# Patient Record
Sex: Male | Born: 1955 | Race: White | Hispanic: No | Smoking: Former smoker
Health system: Southern US, Community
[De-identification: ages and names within clinical notes are randomized; demographics above are authoritative.]

## PROBLEM LIST (undated history)

## (undated) DIAGNOSIS — G4730 Sleep apnea, unspecified: Secondary | ICD-10-CM

## (undated) DIAGNOSIS — I2510 Atherosclerotic heart disease of native coronary artery without angina pectoris: Secondary | ICD-10-CM

## (undated) DIAGNOSIS — I10 Essential (primary) hypertension: Secondary | ICD-10-CM

## (undated) DIAGNOSIS — T753XXA Motion sickness, initial encounter: Secondary | ICD-10-CM

## (undated) DIAGNOSIS — Z972 Presence of dental prosthetic device (complete) (partial): Secondary | ICD-10-CM

## (undated) DIAGNOSIS — K259 Gastric ulcer, unspecified as acute or chronic, without hemorrhage or perforation: Secondary | ICD-10-CM

## (undated) DIAGNOSIS — R011 Cardiac murmur, unspecified: Secondary | ICD-10-CM

## (undated) DIAGNOSIS — E785 Hyperlipidemia, unspecified: Secondary | ICD-10-CM

## (undated) DIAGNOSIS — Z91148 Patient's other noncompliance with medication regimen for other reason: Secondary | ICD-10-CM

## (undated) DIAGNOSIS — R55 Syncope and collapse: Secondary | ICD-10-CM

## (undated) DIAGNOSIS — I776 Arteritis, unspecified: Secondary | ICD-10-CM

## (undated) DIAGNOSIS — Z9114 Patient's other noncompliance with medication regimen: Secondary | ICD-10-CM

## (undated) HISTORY — PX: APPENDECTOMY: SHX54

## (undated) HISTORY — DX: Patient's other noncompliance with medication regimen for other reason: Z91.148

## (undated) HISTORY — DX: Syncope and collapse: R55

## (undated) HISTORY — DX: Essential (primary) hypertension: I10

## (undated) HISTORY — DX: Hyperlipidemia, unspecified: E78.5

## (undated) HISTORY — DX: Gastric ulcer, unspecified as acute or chronic, without hemorrhage or perforation: K25.9

## (undated) HISTORY — PX: HERNIA REPAIR: SHX51

## (undated) HISTORY — DX: Atherosclerotic heart disease of native coronary artery without angina pectoris: I25.10

## (undated) HISTORY — DX: Morbid (severe) obesity due to excess calories: E66.01

## (undated) HISTORY — DX: Arteritis, unspecified: I77.6

## (undated) HISTORY — DX: Patient's other noncompliance with medication regimen: Z91.14

---

## 2011-10-02 ENCOUNTER — Emergency Department: Payer: Self-pay | Admitting: Emergency Medicine

## 2011-10-02 LAB — CBC
HCT: 51.6 % (ref 40.0–52.0)
HGB: 17.5 g/dL (ref 13.0–18.0)
MCH: 30.9 pg (ref 26.0–34.0)
MCV: 91 fL (ref 80–100)
Platelet: 294 10*3/uL (ref 150–440)
RBC: 5.65 10*6/uL (ref 4.40–5.90)
RDW: 13.2 % (ref 11.5–14.5)

## 2011-10-02 LAB — COMPREHENSIVE METABOLIC PANEL
Albumin: 4.3 g/dL (ref 3.4–5.0)
BUN: 6 mg/dL — ABNORMAL LOW (ref 7–18)
Bilirubin,Total: 1.3 mg/dL — ABNORMAL HIGH (ref 0.2–1.0)
Calcium, Total: 9.4 mg/dL (ref 8.5–10.1)
Co2: 28 mmol/L (ref 21–32)
EGFR (African American): >60
Potassium: 4.3 mmol/L (ref 3.5–5.1)
SGOT(AST): 31 U/L (ref 15–37)
SGPT (ALT): 32 U/L
Sodium: 138 mmol/L (ref 136–145)

## 2012-12-03 DIAGNOSIS — I219 Acute myocardial infarction, unspecified: Secondary | ICD-10-CM

## 2012-12-03 DIAGNOSIS — I2510 Atherosclerotic heart disease of native coronary artery without angina pectoris: Secondary | ICD-10-CM | POA: Insufficient documentation

## 2012-12-03 HISTORY — PX: CARDIAC CATHETERIZATION: SHX172

## 2012-12-03 HISTORY — DX: Acute myocardial infarction, unspecified: I21.9

## 2012-12-18 ENCOUNTER — Inpatient Hospital Stay: Payer: Self-pay | Admitting: Cardiovascular Disease

## 2012-12-18 ENCOUNTER — Encounter: Payer: Self-pay | Admitting: Cardiovascular Disease

## 2012-12-18 DIAGNOSIS — I2510 Atherosclerotic heart disease of native coronary artery without angina pectoris: Secondary | ICD-10-CM

## 2012-12-18 DIAGNOSIS — I2119 ST elevation (STEMI) myocardial infarction involving other coronary artery of inferior wall: Secondary | ICD-10-CM

## 2012-12-18 LAB — CBC
MCH: 30.7 pg (ref 26.0–34.0)
MCHC: 34.5 g/dL (ref 32.0–36.0)
MCV: 89 fL (ref 80–100)
RBC: 5.85 10*6/uL (ref 4.40–5.90)
RDW: 14.9 % — ABNORMAL HIGH (ref 11.5–14.5)

## 2012-12-18 LAB — BASIC METABOLIC PANEL
Anion Gap: 7 (ref 7–16)
BUN: 10 mg/dL (ref 7–18)
Calcium, Total: 9.4 mg/dL (ref 8.5–10.1)
Chloride: 103 mmol/L (ref 98–107)
Creatinine: 1.1 mg/dL (ref 0.60–1.30)
EGFR (Non-African Amer.): >60
Glucose: 114 mg/dL — ABNORMAL HIGH (ref 65–99)
Osmolality: 270 (ref 275–301)
Potassium: 4.1 mmol/L (ref 3.5–5.1)
Sodium: 135 mmol/L — ABNORMAL LOW (ref 136–145)

## 2012-12-18 LAB — PROTIME-INR: INR: 1

## 2012-12-18 LAB — TROPONIN I
Troponin-I: <0.02 ng/mL
Troponin-I: 5.8 ng/mL — ABNORMAL HIGH

## 2012-12-18 LAB — CK TOTAL AND CKMB (NOT AT ARMC)
CK, Total: 164 U/L (ref 35–232)
CK, Total: 219 U/L (ref 35–232)
CK-MB: 1.1 ng/mL (ref 0.5–3.6)

## 2012-12-19 ENCOUNTER — Encounter: Payer: Self-pay | Admitting: Cardiovascular Disease

## 2012-12-19 DIAGNOSIS — I2119 ST elevation (STEMI) myocardial infarction involving other coronary artery of inferior wall: Secondary | ICD-10-CM

## 2012-12-19 LAB — BASIC METABOLIC PANEL
Anion Gap: 5 — ABNORMAL LOW (ref 7–16)
Calcium, Total: 8.8 mg/dL (ref 8.5–10.1)
Chloride: 106 mmol/L (ref 98–107)
Co2: 25 mmol/L (ref 21–32)
Creatinine: 0.98 mg/dL (ref 0.60–1.30)
EGFR (African American): >60
EGFR (Non-African Amer.): >60
Osmolality: 270 (ref 275–301)
Potassium: 4.1 mmol/L (ref 3.5–5.1)

## 2012-12-19 LAB — LIPID PANEL
HDL Cholesterol: 31 mg/dL — ABNORMAL LOW (ref 40–60)
Ldl Cholesterol, Calc: 153 mg/dL — ABNORMAL HIGH (ref 0–100)

## 2012-12-19 LAB — TROPONIN I: Troponin-I: 3.3 ng/mL — ABNORMAL HIGH

## 2012-12-20 ENCOUNTER — Telehealth: Payer: Self-pay

## 2012-12-20 NOTE — Telephone Encounter (Signed)
Message copied by [facility], Tonia Avino E on Wed Dec 20, 2012 11:05 AM ------      Message from: Coralee Rud      Created: Wed Dec 20, 2012 10:59 AM      Regarding: tcm/ph       Dr Kirke Corin 12/25/12 at 10:30 ------

## 2012-12-20 NOTE — Telephone Encounter (Signed)
tcm

## 2012-12-20 NOTE — Telephone Encounter (Signed)
Pt d/c today 6/18 Will attempt TCM #1 6/19

## 2012-12-21 NOTE — Telephone Encounter (Signed)
TCM #1 attempt Per family member pt not home at the time Will try again tomm

## 2012-12-22 ENCOUNTER — Encounter: Payer: Self-pay | Admitting: *Deleted

## 2012-12-22 NOTE — Telephone Encounter (Signed)
Patient contacted regarding discharge from [facility] on 12/25/12.  Patient understands to follow up with provider Dr Kirke Corin on 6/23 at 10:30 at [place]. Patient understands discharge instructions? yes Patient understands medications and regiment? yes Patient understands to bring all medications to this visit? yes

## 2012-12-25 ENCOUNTER — Encounter: Payer: Self-pay | Admitting: Cardiovascular Disease

## 2012-12-25 ENCOUNTER — Ambulatory Visit (INDEPENDENT_AMBULATORY_CARE_PROVIDER_SITE_OTHER): Payer: Medicaid Other | Admitting: Cardiovascular Disease

## 2012-12-25 VITALS — BP 130/80 | HR 62 | Ht 70.5 in | Wt 193.8 lb

## 2012-12-25 DIAGNOSIS — E785 Hyperlipidemia, unspecified: Secondary | ICD-10-CM | POA: Insufficient documentation

## 2012-12-25 DIAGNOSIS — R0789 Other chest pain: Secondary | ICD-10-CM

## 2012-12-25 DIAGNOSIS — I2510 Atherosclerotic heart disease of native coronary artery without angina pectoris: Secondary | ICD-10-CM

## 2012-12-25 DIAGNOSIS — I10 Essential (primary) hypertension: Secondary | ICD-10-CM | POA: Insufficient documentation

## 2012-12-25 MED ORDER — CLOPIDOGREL BISULFATE 75 MG PO TABS: 75.0000 mg | ORAL_TABLET | Freq: Every day | ORAL | Status: DC

## 2012-12-25 NOTE — Assessment & Plan Note (Signed)
He is currently on high dose atorvastatin. I recommend a followup lipid and liver profile in one month.

## 2012-12-25 NOTE — Assessment & Plan Note (Signed)
He is doing very well after recent inferior myocardial infarction and drug-eluting stent placement to the right coronary artery. He is tolerating dual antiplatelet therapy. However, due to cost reasons, I will switch him from Effient to Plavix once he finishes current supply. He is to stay on dual antiplatelet therapy for a minimum of one year. He can resume activities and exercise. He quit smoking and was congratulated.

## 2012-12-25 NOTE — Progress Notes (Signed)
HPI  This is a 57 year old male who is here today for a followup visit. He presented early this month at [name] with acute inferior ST elevation myocardial infarction. He underwent cardiac catheterization which showed an occluded proximal RCA with mild disease in the left circumflex and LAD. He underwent successful thrombectomy and drug-eluting stent placement without complications. Ejection fraction was normal. Hospital course was unremarkable. Overall, he has been doing very well and denies any significant chest discomfort or dyspnea. He feels that his chest is still bruised. He quit smoking since that event.   No Known Allergies   Current Outpatient Prescriptions on File Prior to Visit  Medication Sig Dispense Refill  . aspirin 81 MG tablet Take 81 mg by mouth daily.      [name] atorvastatin (LIPITOR) 80 MG tablet Take 80 mg by mouth daily.      [name] lisinopril (PRINIVIL,ZESTRIL) 20 MG tablet Take 20 mg by mouth daily.      . metoprolol tartrate (LOPRESSOR) 25 MG tablet Take 25 mg by mouth every 12 (twelve) hours.      . nitroGLYCERIN (NITROSTAT) 0.4 MG SL tablet Place 0.4 mg under the tongue every 5 (five) minutes as needed for chest pain.      . prasugrel (EFFIENT) 10 MG TABS Take 10 mg by mouth daily.       No current facility-administered medications on file prior to visit.     Past Medical History  Diagnosis Date  . Stomach ulcer   . MI (myocardial infarction)   . Coronary artery disease 06 / 2014    Acute inferior ST elevation myocardial infarction. Cardiac catheterization showed an occluded proximal RCA with mild LAD/left circumflex disease. Successful thrombectomy and drug-eluting stent placement to the proximal RCA. Ejection fraction was normal.  . Hyperlipidemia   . Hypertension      Past Surgical History  Procedure Laterality Date  . Hernia repair    . Appendectomy    . Cardiac catheterization  6/14    [facility];EF 65%     Family History  Problem Relation Age of Onset    . Heart failure Father   . Heart disease Father      History   Social History  . Marital Status: Married    Spouse Name: [name]    Number of Children: [name]  . Years of Education: [name]   Occupational History  . Not on file.   Social History Main Topics  . Smoking status: Current Every Day Smoker -- 1.00 packs/day for 30 years    Types: Cigarettes  . Smokeless tobacco: Not on file  . Alcohol Use: No  . Drug Use: Yes     Comment: past marijuana  . Sexually Active: Not on file   Other Topics Concern  . Not on file   Social History Narrative  . No narrative on file     PHYSICAL EXAM   BP 130/80  Pulse 62  Ht 5' 10.5" (1.791 m)  Wt 193 lb 12 oz (87.884 kg)  BMI 27.4 kg/m2 Constitutional: He is oriented to person, place, and time. He appears well-developed and well-nourished. No distress.  HENT: No nasal discharge.  Head: Normocephalic and atraumatic.  Eyes: Pupils are equal and round. Right eye exhibits no discharge. Left eye exhibits no discharge.  Neck: Normal range of motion. Neck supple. No JVD present. No thyromegaly present.  Cardiovascular: Normal rate, regular rhythm, normal heart sounds and. Exam reveals no gallop and no friction rub. No  murmur heard.  Pulmonary/Chest: Effort normal and breath sounds normal. No stridor. No respiratory distress. He has no wheezes. He has no rales. He exhibits no tenderness.  Abdominal: Soft. Bowel sounds are normal. He exhibits no distension. There is no tenderness. There is no rebound and no guarding.  Musculoskeletal: Normal range of motion. He exhibits no edema and no tenderness.  Neurological: He is alert and oriented to person, place, and time. Coordination normal.  Skin: Skin is warm and dry. No rash noted. He is not diaphoretic. No erythema. No pallor.  Psychiatric: He has a normal mood and affect. His behavior is normal. Judgment and thought content normal.  Right radial pulse is normal with no hematoma.     [id number]:[id number]   Rhythm  -Left axis -anterior fascicular block.   -  Nonspecific T-abnormality.   ABNORMAL     ASSESSMENT AND PLAN

## 2012-12-25 NOTE — Assessment & Plan Note (Signed)
Blood pressure is well controlled on current medications. 

## 2012-12-25 NOTE — Patient Instructions (Addendum)
Start Plavix after you finish current supplies of Effient.  Continue other medications.  Fasting Labs in 1 month.  Follow up in 3 months

## 2013-01-19 ENCOUNTER — Telehealth: Payer: Self-pay | Admitting: *Deleted

## 2013-01-19 ENCOUNTER — Other Ambulatory Visit: Payer: Self-pay | Admitting: *Deleted

## 2013-01-19 MED ORDER — LISINOPRIL 20 MG PO TABS: 20.0000 mg | ORAL_TABLET | Freq: Every day | ORAL | Status: DC

## 2013-01-19 MED ORDER — CLOPIDOGREL BISULFATE 75 MG PO TABS: 75.0000 mg | ORAL_TABLET | Freq: Every day | ORAL | Status: DC

## 2013-01-19 MED ORDER — ATORVASTATIN CALCIUM 80 MG PO TABS: 80.0000 mg | ORAL_TABLET | Freq: Every day | ORAL | Status: DC

## 2013-01-19 MED ORDER — METOPROLOL TARTRATE 25 MG PO TABS: 25.0000 mg | ORAL_TABLET | Freq: Two times a day (BID) | ORAL | Status: DC

## 2013-01-19 NOTE — Telephone Encounter (Signed)
Refill was called into pharmacy and they have questions regarding the amount, please call back at [phone]

## 2013-01-19 NOTE — Telephone Encounter (Signed)
Refilled Lipitor, Plavix, lisinopril and metoprolol sent to [facility].

## 2013-01-19 NOTE — Telephone Encounter (Signed)
Notified pharmacy patient needed to have 60 tablets for the metoprolol. Told pharmacy patient can have 60 tablets.

## 2013-01-24 ENCOUNTER — Ambulatory Visit (INDEPENDENT_AMBULATORY_CARE_PROVIDER_SITE_OTHER): Payer: Medicaid Other

## 2013-01-24 DIAGNOSIS — E785 Hyperlipidemia, unspecified: Secondary | ICD-10-CM

## 2013-01-25 ENCOUNTER — Encounter: Payer: Self-pay | Admitting: *Deleted

## 2013-01-25 LAB — HEPATIC FUNCTION PANEL
AST: 49 IU/L — ABNORMAL HIGH (ref 0–40)
Albumin: 4.4 g/dL (ref 3.5–5.5)
Total Bilirubin: 0.9 mg/dL (ref 0.0–1.2)
Total Protein: 7.4 g/dL (ref 6.0–8.5)

## 2013-01-25 LAB — LIPID PANEL
Chol/HDL Ratio: 3.2 ratio units (ref 0.0–5.0)
HDL: 32 mg/dL — ABNORMAL LOW (ref >39)
LDL Calculated: 51 mg/dL (ref 0–99)

## 2013-02-06 ENCOUNTER — Telehealth: Payer: Self-pay

## 2013-02-06 NOTE — Telephone Encounter (Signed)
Request from Disability Determination Services , sent to [employer] on 02/06/2013 .

## 2013-02-19 ENCOUNTER — Telehealth: Payer: Self-pay | Admitting: *Deleted

## 2013-02-19 NOTE — Telephone Encounter (Signed)
Request from Disability Determination Services, sent to [employer] on 02/19/13 .

## 2013-02-22 ENCOUNTER — Telehealth: Payer: Self-pay | Admitting: *Deleted

## 2013-02-22 MED ORDER — ATORVASTATIN CALCIUM 80 MG PO TABS: ORAL_TABLET | ORAL | Status: DC

## 2013-02-22 NOTE — Telephone Encounter (Addendum)
Called patient in follow up to lab work completed on 7/23. He will decrease Lipitor to 40mg  every day and come back in 1 month to have labs checked during MD visit. Patient aware to come to appointment fasting so we can recheck his lipid and liver panel.

## 2013-03-29 ENCOUNTER — Ambulatory Visit: Payer: Self-pay | Admitting: Cardiovascular Disease

## 2013-03-30 ENCOUNTER — Ambulatory Visit: Payer: Self-pay | Admitting: Cardiovascular Disease

## 2013-03-30 ENCOUNTER — Telehealth: Payer: Self-pay | Admitting: *Deleted

## 2013-03-30 NOTE — Telephone Encounter (Signed)
Pt has an afternoon app next week, wants to know if he needs to be fasting, I will see if dr Kirke Corin needs to recheck lipids/ pt needs liver recheck due to elevated liver  Enzymes.   Spoke with Dr Arida/ pt will only need liver, no lipid, pt informed.

## 2013-04-02 ENCOUNTER — Encounter: Payer: Self-pay | Admitting: Cardiovascular Disease

## 2013-04-02 ENCOUNTER — Ambulatory Visit (INDEPENDENT_AMBULATORY_CARE_PROVIDER_SITE_OTHER): Payer: Medicaid Other | Admitting: Cardiovascular Disease

## 2013-04-02 VITALS — BP 166/102 | HR 65 | Ht 70.5 in | Wt 195.8 lb

## 2013-04-02 DIAGNOSIS — I2510 Atherosclerotic heart disease of native coronary artery without angina pectoris: Secondary | ICD-10-CM

## 2013-04-02 DIAGNOSIS — R0789 Other chest pain: Secondary | ICD-10-CM

## 2013-04-02 DIAGNOSIS — I10 Essential (primary) hypertension: Secondary | ICD-10-CM

## 2013-04-02 DIAGNOSIS — E785 Hyperlipidemia, unspecified: Secondary | ICD-10-CM

## 2013-04-02 MED ORDER — ATORVASTATIN CALCIUM 10 MG PO TABS: 10.0000 mg | ORAL_TABLET | Freq: Every day | ORAL | Status: DC

## 2013-04-02 MED ORDER — LISINOPRIL 40 MG PO TABS: 40.0000 mg | ORAL_TABLET | Freq: Every day | ORAL | Status: DC

## 2013-04-02 NOTE — Assessment & Plan Note (Signed)
He had recent elevation in liver profile on high dose atorvastatin. He also has bilateral leg cramps which could be related to this. Thus, I will decrease the dose to 10 mg daily. I will repeat his liver profile today. If he continues to have lower extremity cramps, I will consider lower extremity arterial Doppler given his diminished pulses and previous history of smoking.

## 2013-04-02 NOTE — Patient Instructions (Addendum)
Labs today.   Increase Lisinopril to 40 mg once daily.  Decrease Atorvastatin 10 mg daily.   Follow up in 6 months.

## 2013-04-02 NOTE — Assessment & Plan Note (Signed)
Blood pressure is elevated. I increased lisinopril to 40 mg once daily. Check routine labs today.

## 2013-04-02 NOTE — Assessment & Plan Note (Signed)
He seems to be stable from a cardiac standpoint. Continue medical therapy. He is to stay on dual antiplatelet therapy until June of 2015.

## 2013-04-02 NOTE — Progress Notes (Signed)
HPI  This is a 57 year old male who is here today for a followup visit. He presented in 12/2012 to [facility] with acute inferior ST elevation myocardial infarction. He underwent cardiac catheterization which showed an occluded proximal RCA with mild disease in the left circumflex and LAD. He underwent successful thrombectomy and drug-eluting stent placement without complications. Ejection fraction was normal. Hospital course was unremarkable. He quit smoking since that event. He did have elevated liver enzymes on atorvastatin and thus the dose was decreased to 40 mg once daily. He complains of bilateral leg cramps with walking worse on the right side than the left side. He complains of occasional but not frequent chest discomfort which has been mostly nonexertional. He does have mild dyspnea.   No Known Allergies   Current Outpatient Prescriptions on File Prior to Visit  Medication Sig Dispense Refill  . aspirin 81 MG tablet Take 81 mg by mouth daily.      [name] atorvastatin (LIPITOR) 80 MG tablet One half tab every day      . clopidogrel (PLAVIX) 75 MG tablet Take 1 tablet (75 mg total) by mouth daily.  30 tablet  3  . lisinopril (PRINIVIL,ZESTRIL) 20 MG tablet Take 1 tablet (20 mg total) by mouth daily.  30 tablet  3  . metoprolol tartrate (LOPRESSOR) 25 MG tablet Take 1 tablet (25 mg total) by mouth every 12 (twelve) hours.  30 tablet  3  . nitroGLYCERIN (NITROSTAT) 0.4 MG SL tablet Place 0.4 mg under the tongue every 5 (five) minutes as needed for chest pain.      . prasugrel (EFFIENT) 10 MG TABS Take 10 mg by mouth daily.       No current facility-administered medications on file prior to visit.     Past Medical History  Diagnosis Date  . Stomach ulcer   . MI (myocardial infarction)   . Coronary artery disease 06 / 2014    Acute inferior ST elevation myocardial infarction. Cardiac catheterization showed an occluded proximal RCA with mild LAD/left circumflex disease. Successful thrombectomy  and drug-eluting stent placement to the proximal RCA. Ejection fraction was normal.  . Hyperlipidemia   . Hypertension      Past Surgical History  Procedure Laterality Date  . Hernia repair    . Appendectomy    . Cardiac catheterization  6/14    [facility];EF 65%     Family History  Problem Relation Age of Onset  . Heart failure Father   . Heart disease Father      History   Social History  . Marital Status: Married    Spouse Name: [name]    Number of Children: [name]  . Years of Education: [name]   Occupational History  . Not on file.   Social History Main Topics  . Smoking status: Current Every Day Smoker -- 1.00 packs/day for 30 years    Types: Cigarettes  . Smokeless tobacco: Not on file  . Alcohol Use: No  . Drug Use: Yes     Comment: past marijuana  . Sexual Activity: Not on file   Other Topics Concern  . Not on file   Social History Narrative  . No narrative on file     PHYSICAL EXAM   BP 166/102  Pulse 65  Ht 5' 10.5" (1.791 m)  Wt 195 lb 12 oz (88.792 kg)  BMI 27.68 kg/m2 Constitutional: He is oriented to person, place, and time. He appears well-developed and well-nourished. No distress.  HENT: No nasal  discharge.  Head: Normocephalic and atraumatic.  Eyes: Pupils are equal and round. Right eye exhibits no discharge. Left eye exhibits no discharge.  Neck: Normal range of motion. Neck supple. No JVD present. No thyromegaly present.  Cardiovascular: Normal rate, regular rhythm, normal heart sounds and. Exam reveals no gallop and no friction rub. No murmur heard.  Pulmonary/Chest: Effort normal and breath sounds normal. No stridor. No respiratory distress. He has no wheezes. He has no rales. He exhibits no tenderness.  Abdominal: Soft. Bowel sounds are normal. He exhibits no distension. There is no tenderness. There is no rebound and no guarding.  Musculoskeletal: Normal range of motion. He exhibits no edema and no tenderness.  Neurological: He is alert and  oriented to person, place, and time. Coordination normal.  Skin: Skin is warm and dry. No rash noted. He is not diaphoretic. No erythema. No pallor.  Psychiatric: He has a normal mood and affect. His behavior is normal. Judgment and thought content normal.  Vascular: Femoral pulses are normal. Dorsalis pedis: Absent on the right side and +1 on the left side. Posterior tibial is normal bilaterally.     EKG: Normal sinus rhythm with left axis deviation. Nonspecific T wave changes.  ABNORMAL     ASSESSMENT AND PLAN

## 2013-04-03 LAB — CBC WITH DIFFERENTIAL/PLATELET
Basophils Absolute: 0.1 10*3/uL (ref 0.0–0.2)
HCT: 45.1 % (ref 37.5–51.0)
Immature Granulocytes: 0 %
Lymphocytes Absolute: 2.1 10*3/uL (ref 0.7–3.1)
MCHC: 34.4 g/dL (ref 31.5–35.7)
Monocytes Absolute: 0.5 10*3/uL (ref 0.1–0.9)
Monocytes: 7 %
RDW: 14.2 % (ref 12.3–15.4)

## 2013-04-03 LAB — BASIC METABOLIC PANEL
BUN/Creatinine Ratio: 14 (ref 9–20)
BUN: 15 mg/dL (ref 6–24)
CO2: 25 mmol/L (ref 18–29)
Chloride: 98 mmol/L (ref 97–108)
Potassium: 5.3 mmol/L — ABNORMAL HIGH (ref 3.5–5.2)
Sodium: 140 mmol/L (ref 134–144)

## 2013-04-03 LAB — HEPATIC FUNCTION PANEL
ALT: 81 IU/L — ABNORMAL HIGH (ref 0–44)
AST: 46 IU/L — ABNORMAL HIGH (ref 0–40)

## 2013-04-05 ENCOUNTER — Telehealth: Payer: Self-pay

## 2013-04-05 DIAGNOSIS — I25810 Atherosclerosis of coronary artery bypass graft(s) without angina pectoris: Secondary | ICD-10-CM

## 2013-04-05 DIAGNOSIS — E785 Hyperlipidemia, unspecified: Secondary | ICD-10-CM

## 2013-04-05 NOTE — Telephone Encounter (Signed)
Message copied by Marilynne Halsted on Thu Apr 05, 2013 10:21 AM ------      Message from: Lorine Bears A      Created: Wed Apr 04, 2013  4:06 PM       Inform patient that labs were fine. K is slightly high but likely due to delayed processing. Liver function improved but still not back normal. I am hoping that liver test will go back to normal after decreasing Atorvastatin dose.       Repeat liver profile in 4-5 weeks. ------

## 2013-04-05 NOTE — Telephone Encounter (Signed)
Spoke w/ pt.  He is aware of results.   States that he was not aware he was supposed to be fasting and drank some apple juice before having labs drawn.  He is aware that he needs to have labs drawn in 4-5 weeks, but does not want to schedule that appt at this time.  He will note it on his calendar and call us back with a date and time.

## 2013-04-09 ENCOUNTER — Telehealth: Payer: Self-pay

## 2013-04-09 NOTE — Telephone Encounter (Signed)
Request from Disability Determination Services, sent to [employer] on 04/10/2013 .  

## 2013-04-25 ENCOUNTER — Telehealth: Payer: Self-pay

## 2013-04-25 NOTE — Telephone Encounter (Signed)
Request from Disability Determination Services, sent to [employer] on 04/25/2013 . [name]

## 2013-04-27 ENCOUNTER — Telehealth: Payer: Self-pay

## 2013-04-27 NOTE — Telephone Encounter (Signed)
Received fax from cardiac rehab at [facility] that pt did not show to appt, "cost of program is a financial hardship".

## 2013-04-30 ENCOUNTER — Other Ambulatory Visit: Payer: Self-pay | Admitting: *Deleted

## 2013-04-30 MED ORDER — ATORVASTATIN CALCIUM 10 MG PO TABS: 10.0000 mg | ORAL_TABLET | Freq: Every day | ORAL | Status: DC

## 2013-04-30 MED ORDER — LISINOPRIL 40 MG PO TABS: 40.0000 mg | ORAL_TABLET | Freq: Every day | ORAL | Status: DC

## 2013-04-30 NOTE — Telephone Encounter (Signed)
Requested Prescriptions   Signed Prescriptions Disp Refills  . lisinopril (PRINIVIL,ZESTRIL) 40 MG tablet 30 tablet 6    Sig: Take 1 tablet (40 mg total) by mouth daily.    Authorizing Provider: Lorine Bears A    Ordering User: Shawnie Dapper, Errol Ala C  . atorvastatin (LIPITOR) 10 MG tablet 30 tablet 6    Sig: Take 1 tablet (10 mg total) by mouth daily.    Authorizing Provider: Lorine Bears A    Ordering User: Kendrick Fries

## 2013-04-30 NOTE — Telephone Encounter (Signed)
[facility] is too expenisive please send into [facility]

## 2013-05-18 ENCOUNTER — Other Ambulatory Visit: Payer: Self-pay

## 2013-05-18 MED ORDER — METOPROLOL TARTRATE 25 MG PO TABS: 25.0000 mg | ORAL_TABLET | Freq: Two times a day (BID) | ORAL | Status: DC

## 2013-05-18 NOTE — Telephone Encounter (Signed)
Refill sent for metoprolol tart  

## 2013-05-23 ENCOUNTER — Telehealth: Payer: Self-pay

## 2013-05-23 NOTE — Telephone Encounter (Signed)
Request from [employer] , sent to [employer] on 05/23/2013 .

## 2013-06-11 ENCOUNTER — Telehealth: Payer: Self-pay

## 2013-06-11 NOTE — Telephone Encounter (Signed)
Request from [employer] , sent to [employer] on 06/11/2013 .

## 2013-06-25 ENCOUNTER — Telehealth: Payer: Self-pay

## 2013-06-25 NOTE — Telephone Encounter (Signed)
Request from Disability Determination Services, sent to [employer] on 06/25/2013  ORIGINAL REQUEST RECEIVED AND SENT .

## 2013-10-12 ENCOUNTER — Encounter: Payer: Self-pay | Admitting: Cardiovascular Disease

## 2013-10-12 ENCOUNTER — Ambulatory Visit (INDEPENDENT_AMBULATORY_CARE_PROVIDER_SITE_OTHER): Payer: Medicaid Other | Admitting: Cardiovascular Disease

## 2013-10-12 VITALS — BP 110/80 | HR 66 | Ht 70.5 in | Wt 190.0 lb

## 2013-10-12 DIAGNOSIS — E785 Hyperlipidemia, unspecified: Secondary | ICD-10-CM

## 2013-10-12 DIAGNOSIS — R079 Chest pain, unspecified: Secondary | ICD-10-CM

## 2013-10-12 DIAGNOSIS — I10 Essential (primary) hypertension: Secondary | ICD-10-CM

## 2013-10-12 NOTE — Assessment & Plan Note (Signed)
He had a recent episode of exertional chest pain with exercise. Current EKG is normal. I recommend a treadmill Myoview for evaluation. He has known history of coronary artery disease with stenting of the right coronary artery last year in the setting of inferior ST elevation myocardial infarction. Continue dual antiplatelet therapy for at least another year.

## 2013-10-12 NOTE — Patient Instructions (Addendum)
[place]  Your caregiver has ordered a Stress Test with nuclear imaging. The purpose of this test is to evaluate the blood supply to your heart muscle. This procedure is referred to as a "Non-Invasive Stress Test." This is because other than having an IV started in your vein, nothing is inserted or "invades" your body. Cardiac stress tests are done to find areas of poor blood flow to the heart by determining the extent of coronary artery disease (CAD). Some patients exercise on a treadmill, which naturally increases the blood flow to your heart, while others who are  unable to walk on a treadmill due to physical limitations have a pharmacologic/chemical stress agent called Lexiscan . This medicine will mimic walking on a treadmill by temporarily increasing your coronary blood flow.   Please note: these test may take anywhere between 2-4 hours to complete  PLEASE REPORT TO [facility] [facility] AT THE FIRST DESK WILL DIRECT YOU WHERE TO GO  Date of Procedure:_____________4/14/15________________________  Arrival Time for Procedure:___________0745 AM___________________  Instructions regarding medication:     _X__:  Hold betablocker(s) night before procedure and morning of procedure: Metoprolol   PLEASE NOTIFY THE OFFICE AT LEAST 24 HOURS IN ADVANCE IF YOU ARE UNABLE TO KEEP YOUR APPOINTMENT.  [phone] AND  PLEASE NOTIFY NUCLEAR MEDICINE AT [facility] AT LEAST 24 HOURS IN ADVANCE IF YOU ARE UNABLE TO KEEP YOUR APPOINTMENT. [phone]  How to prepare for your Myoview test:  1. Do not eat or drink after midnight 2. No caffeine for 24 hours prior to test 3. No smoking 24 hours prior to test. 4. Your medication may be taken with water.  If your doctor stopped a medication because of this test, do not take that medication. 5. Ladies, please do not wear dresses.  Skirts or pants are appropriate. Please wear a short sleeve shirt. 6. No perfume, cologne or lotion. 7. Wear  comfortable walking shoes. No heels!  Your physician recommends that you have lab work today: BMP  Lipid and Liver Profile    Your physician wants you to follow-up in: 6 months receive a reminder letter in the mail two months in advance. If you don't receive a letter, please call our office to schedule the follow-up appointment.

## 2013-10-12 NOTE — Assessment & Plan Note (Signed)
Recheck fasting lipid and liver profile especially that he had a normal liver tests on higher dose of atorvastatin.

## 2013-10-12 NOTE — Progress Notes (Signed)
HPI  This is a 58 year old male who is here today for a followup visit. He presented in 12/2012 to [facility] with acute inferior ST elevation myocardial infarction. He underwent cardiac catheterization which showed an occluded proximal RCA with mild disease in the left circumflex and LAD. He underwent successful thrombectomy and drug-eluting stent placement without complications. Ejection fraction was normal. Hospital course was unremarkable. He quit smoking since that event. He did have elevated liver enzymes on atorvastatin and thus the dose was gradually decreased to 10 mg once daily. No extremity cramping resolved. He reports an episode of substernal chest tightness about 10 days ago when he tried to play basketball. He had substernal chest tightness with exercise which lasted for about 10 minutes. It resolved with rest and did not require nitroglycerin. He reports mild to episodes of chest tightness with regular activities.   No Known Allergies   Current Outpatient Prescriptions on File Prior to Visit  Medication Sig Dispense Refill  . aspirin 81 MG tablet Take 81 mg by mouth daily.      [name] atorvastatin (LIPITOR) 10 MG tablet Take 1 tablet (10 mg total) by mouth daily.  30 tablet  6  . clopidogrel (PLAVIX) 75 MG tablet Take 1 tablet (75 mg total) by mouth daily.  30 tablet  3  . lisinopril (PRINIVIL,ZESTRIL) 40 MG tablet Take 1 tablet (40 mg total) by mouth daily.  30 tablet  6  . metoprolol tartrate (LOPRESSOR) 25 MG tablet Take 1 tablet (25 mg total) by mouth every 12 (twelve) hours.  60 tablet  6  . nitroGLYCERIN (NITROSTAT) 0.4 MG SL tablet Place 0.4 mg under the tongue every 5 (five) minutes as needed for chest pain.       No current facility-administered medications on file prior to visit.     Past Medical History  Diagnosis Date  . Stomach ulcer   . MI (myocardial infarction)   . Coronary artery disease 06 / 2014    Acute inferior ST elevation myocardial infarction. Cardiac  catheterization showed an occluded proximal RCA with mild LAD/left circumflex disease. Successful thrombectomy and drug-eluting stent placement to the proximal RCA. Ejection fraction was normal.  . Hyperlipidemia   . Hypertension      Past Surgical History  Procedure Laterality Date  . Hernia repair    . Appendectomy    . Cardiac catheterization  6/14    [facility];EF 65%     Family History  Problem Relation Age of Onset  . Heart failure Father   . Heart disease Father      History   Social History  . Marital Status: Married    Spouse Name: [name]    Number of Children: [name]  . Years of Education: [name]   Occupational History  . Not on file.   Social History Main Topics  . Smoking status: Former Smoker -- 1.00 packs/day for 30 years    Types: Cigarettes    Quit date: 12/18/2012  . Smokeless tobacco: Not on file  . Alcohol Use: No  . Drug Use: Yes     Comment: past marijuana  . Sexual Activity: Not on file   Other Topics Concern  . Not on file   Social History Narrative  . No narrative on file     PHYSICAL EXAM   BP 110/80  Pulse 66  Ht 5' 10.5" (1.791 m)  Wt 190 lb (86.183 kg)  BMI 26.87 kg/m2 Constitutional: He is oriented to person, place, and time.  He appears well-developed and well-nourished. No distress.  HENT: No nasal discharge.  Head: Normocephalic and atraumatic.  Eyes: Pupils are equal and round. Right eye exhibits no discharge. Left eye exhibits no discharge.  Neck: Normal range of motion. Neck supple. No JVD present. No thyromegaly present.  Cardiovascular: Normal rate, regular rhythm, normal heart sounds and. Exam reveals no gallop and no friction rub. No murmur heard.  Pulmonary/Chest: Effort normal and breath sounds normal. No stridor. No respiratory distress. He has no wheezes. He has no rales. He exhibits no tenderness.  Abdominal: Soft. Bowel sounds are normal. He exhibits no distension. There is no tenderness. There is no rebound and no  guarding.  Musculoskeletal: Normal range of motion. He exhibits no edema and no tenderness.  Neurological: He is alert and oriented to person, place, and time. Coordination normal.  Skin: Skin is warm and dry. No rash noted. He is not diaphoretic. No erythema. No pallor.  Psychiatric: He has a normal mood and affect. His behavior is normal. Judgment and thought content normal.  Vascular: Femoral pulses are normal.     EKG: Normal sinus rhythm with no significant ST or T wave changes   ASSESSMENT AND PLAN

## 2013-10-12 NOTE — Assessment & Plan Note (Signed)
Blood pressure is well controlled on current medications. Check basic metabolic profile given that he had mild hyperkalemia on lisinopril.

## 2013-10-13 LAB — LIPID PANEL
Chol/HDL Ratio: 3.7 ratio units (ref 0.0–5.0)
Cholesterol, Total: 154 mg/dL (ref 100–199)
HDL: 42 mg/dL (ref >39)
LDL CALC: 88 mg/dL (ref 0–99)
Triglycerides: 120 mg/dL (ref 0–149)
VLDL Cholesterol Cal: 24 mg/dL (ref 5–40)

## 2013-10-13 LAB — HEPATIC FUNCTION PANEL
ALK PHOS: 62 IU/L (ref 39–117)
ALT: 49 IU/L — AB (ref 0–44)
AST: 35 IU/L (ref 0–40)
Albumin: 4.5 g/dL (ref 3.5–5.5)
BILIRUBIN DIRECT: 0.25 mg/dL (ref 0.00–0.40)
Total Bilirubin: 1.2 mg/dL (ref 0.0–1.2)
Total Protein: 7.3 g/dL (ref 6.0–8.5)

## 2013-10-13 LAB — BASIC METABOLIC PANEL
BUN/Creatinine Ratio: 12 (ref 9–20)
BUN: 14 mg/dL (ref 6–24)
CALCIUM: 9.6 mg/dL (ref 8.7–10.2)
CO2: 25 mmol/L (ref 18–29)
Chloride: 98 mmol/L (ref 97–108)
Creatinine, Ser: 1.14 mg/dL (ref 0.76–1.27)
GFR calc Af Amer: 81 mL/min/{1.73_m2} (ref >59)
GFR calc non Af Amer: 70 mL/min/{1.73_m2} (ref >59)
Glucose: 93 mg/dL (ref 65–99)
POTASSIUM: 5.4 mmol/L — AB (ref 3.5–5.2)
Sodium: 140 mmol/L (ref 134–144)

## 2013-10-15 ENCOUNTER — Encounter: Payer: Self-pay | Admitting: Cardiovascular Disease

## 2013-10-16 ENCOUNTER — Ambulatory Visit: Payer: Self-pay | Admitting: Cardiovascular Disease

## 2013-10-16 DIAGNOSIS — R079 Chest pain, unspecified: Secondary | ICD-10-CM

## 2013-10-17 ENCOUNTER — Other Ambulatory Visit: Payer: Self-pay

## 2013-10-17 DIAGNOSIS — R079 Chest pain, unspecified: Secondary | ICD-10-CM

## 2013-11-29 ENCOUNTER — Telehealth: Payer: Self-pay

## 2013-11-29 NOTE — Telephone Encounter (Signed)
Patient needs a refill on his cardiac meds  He wants to make sure that Dr. Fletcher Anon wants him to continue on all of his current meds

## 2013-11-29 NOTE — Telephone Encounter (Signed)
Pt left vm stating he needed to talk with someone about "updating my medications". Please call.

## 2013-11-30 ENCOUNTER — Other Ambulatory Visit: Payer: Self-pay | Admitting: Cardiovascular Disease

## 2013-11-30 ENCOUNTER — Other Ambulatory Visit: Payer: Self-pay | Admitting: *Deleted

## 2013-11-30 MED ORDER — LISINOPRIL 40 MG PO TABS: ORAL_TABLET | ORAL | Status: DC

## 2013-11-30 MED ORDER — CLOPIDOGREL BISULFATE 75 MG PO TABS: 75.0000 mg | ORAL_TABLET | Freq: Every day | ORAL | Status: DC

## 2013-11-30 MED ORDER — METOPROLOL TARTRATE 25 MG PO TABS: 25.0000 mg | ORAL_TABLET | Freq: Two times a day (BID) | ORAL | Status: DC

## 2013-11-30 MED ORDER — ATORVASTATIN CALCIUM 10 MG PO TABS: ORAL_TABLET | ORAL | Status: DC

## 2013-11-30 NOTE — Telephone Encounter (Signed)
Informed patient that his medications have been refilled

## 2013-11-30 NOTE — Telephone Encounter (Signed)
Continue all meds

## 2013-12-20 ENCOUNTER — Other Ambulatory Visit: Payer: Self-pay | Admitting: *Deleted

## 2013-12-20 ENCOUNTER — Other Ambulatory Visit: Payer: Self-pay | Admitting: Cardiovascular Disease

## 2013-12-20 MED ORDER — CLOPIDOGREL BISULFATE 75 MG PO TABS: 75.0000 mg | ORAL_TABLET | Freq: Every day | ORAL | Status: DC

## 2014-01-08 ENCOUNTER — Telehealth: Payer: Self-pay

## 2014-01-08 NOTE — Telephone Encounter (Signed)
States he has a "nerve jumping, or a flutter, feels like something jumping" please call

## 2014-01-08 NOTE — Telephone Encounter (Signed)
LVM for patient to call back  Instructed patient via voicemail to contact EMS if he felt his situation became emergent

## 2014-01-09 NOTE — Telephone Encounter (Signed)
Pt returning your call from yesterday.

## 2014-01-09 NOTE — Telephone Encounter (Signed)
Patient called and stated he feels like there is a "nerve jumping" in his chest  It is located in his left chest he feels like it might be where the stent is on the left side of chest  Patient denies any chest pain  He denies any other symptoms  He states that he been having that "strange twinge" since his cath   Verbally discussed this with Dr. Rockey Situ  Will forward to Dr. Fletcher Anon   Instructed patient to contact EMS if he feels is situation is emergent

## 2014-01-09 NOTE — Telephone Encounter (Signed)
His stress test in April was fine. This could be muscular pain. Is he having symptoms similar to when he had his heart attack? Any chest pain with activities?

## 2014-01-11 NOTE — Telephone Encounter (Signed)
LVM for patient 7/10

## 2014-01-21 NOTE — Telephone Encounter (Signed)
LVM for patient 7/20

## 2014-04-02 ENCOUNTER — Other Ambulatory Visit: Payer: Self-pay | Admitting: Cardiovascular Disease

## 2014-04-22 ENCOUNTER — Other Ambulatory Visit: Payer: Self-pay | Admitting: Cardiovascular Disease

## 2014-05-02 ENCOUNTER — Other Ambulatory Visit: Payer: Self-pay | Admitting: Cardiovascular Disease

## 2014-05-23 ENCOUNTER — Encounter: Payer: Self-pay | Admitting: Cardiovascular Disease

## 2014-05-23 ENCOUNTER — Ambulatory Visit (INDEPENDENT_AMBULATORY_CARE_PROVIDER_SITE_OTHER): Payer: Medicaid Other | Admitting: Cardiovascular Disease

## 2014-05-23 VITALS — BP 130/90 | HR 102 | Ht 70.5 in | Wt 198.8 lb

## 2014-05-23 DIAGNOSIS — R011 Cardiac murmur, unspecified: Secondary | ICD-10-CM | POA: Insufficient documentation

## 2014-05-23 DIAGNOSIS — E785 Hyperlipidemia, unspecified: Secondary | ICD-10-CM

## 2014-05-23 DIAGNOSIS — I2510 Atherosclerotic heart disease of native coronary artery without angina pectoris: Secondary | ICD-10-CM

## 2014-05-23 DIAGNOSIS — I10 Essential (primary) hypertension: Secondary | ICD-10-CM

## 2014-05-23 NOTE — Assessment & Plan Note (Signed)
Blood pressure is mildly elevated but he did not take his medications today.

## 2014-05-23 NOTE — Assessment & Plan Note (Signed)
He has a new heart murmur in the aortic area. I requested an echocardiogram for evaluation.

## 2014-05-23 NOTE — Progress Notes (Signed)
HPI  This is a 58 year old male who is here today for a followup visit. He presented in 12/2012 to [facility] with acute inferior ST elevation myocardial infarction. He underwent cardiac catheterization which showed an occluded proximal RCA with mild disease in the left circumflex and LAD. He underwent successful thrombectomy and drug-eluting stent placement without complications. Ejection fraction was normal.  He quit smoking since that event. He did have elevated liver enzymes on atorvastatin and thus the dose was gradually decreased to 10 mg once daily. Lower extremity cramping resolved. He had exertional chest pain in April. He underwent a nuclear stress test which showed no evidence of ischemia with normal ejection fraction. He reports no further chest pain. He has been taking metoprolol once daily instead of twice daily.   No Known Allergies   Current Outpatient Prescriptions on File Prior to Visit  Medication Sig Dispense Refill  . aspirin 81 MG tablet Take 81 mg by mouth daily.    [name] atorvastatin (LIPITOR) 10 MG tablet TAKE ONE (1) TABLET EACH DAY 30 tablet 3  . clopidogrel (PLAVIX) 75 MG tablet Take 1 tablet (75 mg total) by mouth daily. 30 tablet 3  . lisinopril (PRINIVIL,ZESTRIL) 40 MG tablet TAKE ONE (1) TABLET EACH DAY 30 tablet 3  . metoprolol tartrate (LOPRESSOR) 25 MG tablet Take 1 tablet (25 mg total) by mouth every 12 (twelve) hours. 60 tablet 6  . nitroGLYCERIN (NITROSTAT) 0.4 MG SL tablet Place 0.4 mg under the tongue every 5 (five) minutes as needed for chest pain.     No current facility-administered medications on file prior to visit.     Past Medical History  Diagnosis Date  . Stomach ulcer   . MI (myocardial infarction)   . Coronary artery disease 06 / 2014    Acute inferior ST elevation myocardial infarction. Cardiac catheterization showed an occluded proximal RCA with mild LAD/left circumflex disease. Successful thrombectomy and drug-eluting stent placement to the  proximal RCA. Ejection fraction was normal.  . Hyperlipidemia   . Hypertension      Past Surgical History  Procedure Laterality Date  . Hernia repair    . Appendectomy    . Cardiac catheterization  6/14    [facility];EF 65%     Family History  Problem Relation Age of Onset  . Heart failure Father   . Heart disease Father      History   Social History  . Marital Status: Married    Spouse Name: [name]    Number of Children: [name]  . Years of Education: [name]   Occupational History  . Not on file.   Social History Main Topics  . Smoking status: Former Smoker -- 1.00 packs/day for 30 years    Types: Cigarettes    Quit date: 12/18/2012  . Smokeless tobacco: Not on file  . Alcohol Use: No  . Drug Use: Yes     Comment: past marijuana  . Sexual Activity: Not on file   Other Topics Concern  . Not on file   Social History Narrative     PHYSICAL EXAM   BP 130/90 mmHg  Pulse 102  Ht 5' 10.5" (1.791 m)  Wt 198 lb 12.8 oz (90.175 kg)  BMI 28.11 kg/m2 Constitutional: He is oriented to person, place, and time. He appears well-developed and well-nourished. No distress.  HENT: No nasal discharge.  Head: Normocephalic and atraumatic.  Eyes: Pupils are equal and round. Right eye exhibits no discharge. Left eye exhibits no discharge.  Neck: Normal range of motion. Neck supple. No JVD present. No thyromegaly present.  Cardiovascular: Normal rate, regular rhythm, normal heart sounds and. Exam reveals no gallop and no friction rub. There is a 2/6 systolic ejection murmur in the aortic area  Pulmonary/Chest: Effort normal and breath sounds normal. No stridor. No respiratory distress. He has no wheezes. He has no rales. He exhibits no tenderness.  Abdominal: Soft. Bowel sounds are normal. He exhibits no distension. There is no tenderness. There is no rebound and no guarding.  Musculoskeletal: Normal range of motion. He exhibits no edema and no tenderness.  Neurological: He is alert and  oriented to person, place, and time. Coordination normal.  Skin: Skin is warm and dry. No rash noted. He is not diaphoretic. No erythema. No pallor.  Psychiatric: He has a normal mood and affect. His behavior is normal. Judgment and thought content normal.  Vascular: Femoral pulses are normal.     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   ASSESSMENT AND PLAN

## 2014-05-23 NOTE — Assessment & Plan Note (Signed)
He is doing well overall with no recurrent chest pain. Continue medical therapy. I am planning to continue dual antiplatelet therapy for 2 years after his myocardial infarction.

## 2014-05-23 NOTE — Assessment & Plan Note (Signed)
Lab Results  Component Value Date   HDL 42 10/12/2013   LDLCALC 88 10/12/2013   TRIG 120 10/12/2013   CHOLHDL 3.7 10/12/2013   Continue treatment with small dose atorvastatin. Liver enzymes improved after decreasing the dose. He will require a follow-up fasting lipid and liver profile as well as basic metabolic profile in 6 months.

## 2014-05-23 NOTE — Patient Instructions (Signed)
Your physician has requested that you have an echocardiogram. Echocardiography is a painless test that uses sound waves to create images of your heart. It provides your doctor with information about the size and shape of your heart and how well your heart's chambers and valves are working. This procedure takes approximately one hour. There are no restrictions for this procedure.  Continue same medications.   Your physician wants you to follow-up in: 6 months.  You will receive a reminder letter in the mail two months in advance. If you don't receive a letter, please call our office to schedule the follow-up appointment.

## 2014-06-06 ENCOUNTER — Other Ambulatory Visit (INDEPENDENT_AMBULATORY_CARE_PROVIDER_SITE_OTHER): Payer: Medicaid Other

## 2014-06-06 DIAGNOSIS — R011 Cardiac murmur, unspecified: Secondary | ICD-10-CM

## 2014-07-31 ENCOUNTER — Other Ambulatory Visit: Payer: Self-pay | Admitting: Cardiovascular Disease

## 2014-08-21 ENCOUNTER — Other Ambulatory Visit: Payer: Self-pay | Admitting: Cardiovascular Disease

## 2014-10-25 NOTE — H&P (Signed)
Subjective/Chief Complaint chest pain   History of Present Illness [name] is a 59yo with no prior cardiac history, PMHx s/f HTN and ongoing tobacco abuse who presents to [facility] ED today for chest pain.  He was in his USOH until 11 AM this morning when he experienced sudden onset constant anterior chest pressure radiating to his arms bilaterally with associated diaphoresis. He denies associated shortness of breath, nausea, DOE, PND, orthopnea, LE edema, palpitations, lightheadedness or syncope. The chest pain progressively worsened to a 10/10 thus prompting his ED presentation.   There, EKG revealed changes concerning for inferior ST elevations. Code STEMI was called. He received ASA 81mg  x 4 and supplemental O2. Heparin bolus was called for. CBC unremarkable- borderline polycythemia. Comprehensive labwork/studies unable to be performed due to acuity of situation. He was markedly hypertensive on exam - 235/113, HR 77, RR 18, O2 sat 97-98% on 2 L O2 via n/c. He was given NTG SL x 1. He denied chest pain on exam. The decision was made to pursue emergent cardiac catheterization. The patient was quickly informed of the procedure, risks and benefits and agreed to proceed. He was consented and wheeled to the cardiac catheterization lab.   Past History PAST MEDICAL HISTORY: Hypertension, tobacco abuse  PAST SURGICAL HISTORY: Hernia repair, appendectomy  FAMILY HISTORY: Father with CHF. When asked about familial cardiac history, he confirmed; however was non-specific regarding the details.   SOCIAL HISTORY: Positive for tobacco abuse (1 PPD). Negative for EtOH or illicit drug abuse.   MEDICATIONS: Metoprolol  ALLERGIES: No known drug allergies   Past Medical Health Hypertension, Smoking   Primary Physician Dr. Laurian Brim- [facility]ins, [place]   Past Med/Surgical Hx:  hernia repair:   Appendectomy:   ALLERGIES:  No Known Allergies:   HOME MEDICATIONS: Medication Instructions Status   metoprolol 25 mg tablet 1 tab(s) orally 2 times a day x 30 days Active   Family and Social History:  Family History Father with CHF   Social History positive  tobacco, negative ETOH, negative Illicit drugs   + Tobacco Prior (greater than 1 year)   Place of Living Home   Review of Systems:  Subjective/Chief Complaint chest pain   Fever/Chills No   Cough No   Diarrhea No   Nausea/Vomiting No   SOB/DOE No   Chest Pain Yes   Telemetry Reviewed NSR   ROS denies active bleeding   Physical Exam:  GEN obese Caucasian male in NAD   HEENT pink conjunctivae, PERRL, hearing intact to voice, bilateral periorbital xanthelasma appreciated   NECK supple  No masses  trachea midline   RESP normal resp effort  clear BS  no use of accessory muscles   CARD regular rate  no murmur  no carotid bruits  no JVD  no Rub   VASCULAR ACCESS No bruit, no thrill  radial, DP/PT, femoral pulses 2+ and equal bilaterally   ABD denies tenderness  normal BS  distention of central adiposity   EXTR negative cyanosis/clubbing, negative edema   SKIN normal to palpation, pink, warm, moist, cap refill < 2 sec   NEURO follows commands, motor/sensory function intact   PSYCH alert, A+O to time, place, person   Lab Results:  Routine Hem:  16-Jun-14 13:00   WBC (CBC) 7.4  RBC (CBC) 5.85  Hemoglobin (CBC) 18.0  Hematocrit (CBC) 52.0  Platelet Count (CBC) 270 (Result(s) reported on 18 Dec 2012 at 01:15PM.)  MCV 89  MCH 30.7  MCHC 34.5  RDW  14.9    Assessment/Admission Diagnosis [name] is a 59yo with no prior cardiac history, PMHx s/f HTN and ongoing tobacco abuse who presents to [facility] ED today for chest pain.  1. Inferior STEMI Patient presents to [facility] ED after several hours of constant, anterior chest pressure radiating to his arms with associated diaphoresis occuring today. No prior cardiac history, but likely has undiagnosed cardiac RFs and metabolic syndrome at the least. Objectively,  EKG revealed likely reciprocal changes of inferior STEMI in TWIs V1, V2, I, aVL and ST depressions V4-V6. ST changes in II, III, aVF subtle. LVH, Q waves V1, V2, III, aVF noted. Currently undergoing cardiac catheterization.  -- Admit to CCU post-cath -- Will need low-dose ASA +/- antiplatelet, +/- BB, statin, NTG SL PRN -- Risk stratify further with Hgb A1C and lipid panel tomorrow AM -- Obtain CXR, CMP, TSH, Mg -- Heart healthy diet  -- Further recommendations per interventionalist's findings  2. Hypertension Markedly uncontrolled in the ED secondary to STEMI/pain. On metoprolol outpatient for BP control.  -- Monitor while inpatient -- If remains elevated, consider adding ACEi  3. Tobacco abuse Ongoing at 1 PPD.  -- Stress cessation -- Nicotine patch taper as a form of assistance   Electronic Signatures for Addendum Section:  Kathlyn Sacramento (MD) (Signed Addendum 16-Jun-14 16:56)  The patient was seen and examined. Agree with above. Cardiac cath showed occluded proximal RCA. Underwent successful thrombectomy and DES placement. Continue post MI care. Dual antiplatelet therapy for at least 1 year. Smoking cessation and cardiac rehab.   Electronic Signatures: Meriel Pica (PA-C)  (Signed 16-Jun-14 13:44)  Authored: CHIEF COMPLAINT and HISTORY, PAST MEDICAL/SURGIAL HISTORY, ALLERGIES, HOME MEDICATIONS, FAMILY AND SOCIAL HISTORY, REVIEW OF SYSTEMS, PHYSICAL EXAM, LABS, ASSESSMENT AND PLAN Kathlyn Sacramento (MD)  (Signed 16-Jun-14 16:56)  Co-Signer: CHIEF COMPLAINT and HISTORY, PAST MEDICAL/SURGIAL HISTORY, ALLERGIES, HOME MEDICATIONS, FAMILY AND SOCIAL HISTORY, REVIEW OF SYSTEMS, PHYSICAL EXAM, LABS, ASSESSMENT AND PLAN   Last Updated: 16-Jun-14 16:56 by Kathlyn Sacramento (MD)

## 2014-10-25 NOTE — Discharge Summary (Signed)
Dates of Admission and Diagnosis:  Date of Admission 18-Dec-2012   Date of Discharge 20-Dec-2012   Admitting Diagnosis Inferior STEMI   Final Diagnosis Inferior STEMI   Discharge Diagnosis 1 CAD   2 Hypertension   3 Tobacco abuse   4 Hyperlipidemia    Chief Complaint/History of Present Illness [name] is a 59yo with no prior cardiac history, who was admitted to [facility] on 12/18/12 with the above diagnoses.   He has no previous cardiac history. Past medical history is significant for hypertension and ongoing tobacco abuse.   He was in his USOH the date of admission until 11 AM when he experienced sudden onset constant anterior chest pressure radiating to his arms bilaterally with associated diaphoresis. He denied other associated symptoms. The chest pain progressively worsened to a 10/10 thus prompting his ED presentation.   There, EKG revealed mostly reciprocal changes (flat 2 mm ST depressions in precordial leads, hyperacute inferior TWs) concerning for an evolving inferior ST elevations. Code STEMI was called. He received ASA 31m x 4, NTG SL x 1 (with improvement) and supplemental O2. Heparin bolus was called for. He was markedly hypertensive on exam - 235/113, but vital signs were otherwise stable and he was in no acute distress. The decision was made to pursue emergent cardiac catheterization. The patient was quickly informed of the procedure, risks and benefits and agreed to proceed. He was consented and wheeled to the cardiac catheterization lab.     Cardiac Catherization:  16-Jun-14 13:14   Cardiac Catheter[facility]  [facility] [phone]  Cardiovascular Catheterization Comprehensive Report   Patient: Alan KEDZIERSKIStudy date: 12/18/2012 MR number: [id number] number: [id number]  DOB: 008-16-1957Age: [age]years Gender: Male Race: White Height: 68.9 in Weight: 197.6 lb   Interventional Cardiologist:  MKathlyn Sacramento MD   SUMMARY:   -HEMODYNAMICS: Hemodynamic assessment demonstrates no systemic hypertension, no hypotension, and mildly elevated LVEDP.   -CORONARY CIRCULATION: There was severe 1-vessel coronary artery disease. Proximal RCA: There was a 100 % stenosis. The lesion was associated with a moderate filling defect consistent with thrombus. There was TIMI grade 0 flow through the vessel (no flow). This is a likely culprit for the patient's clinical presentation. An intervention was performed.   -CARDIAC STRUCTURES: Global left ventricular function was normal. EF estimated was 65 %.   -1ST LESION INTERVENTIONS: Aspiration thrombectomy followed by a drug-eluting stent was performed on the 100 % lesion in the proximal RCA. Following intervention there was a 0 % residual stenosis.   -RECOMMENDATIONS: Patient management should include aggressive medical therapy, a cardiac rehabilitation program, and smoking cessation. Recommend dual antiplatelet therapy for at least 12 months.   HEMODYNAMICS: Hemodynamic assessment demonstrates no systemic hypertension, no hypotension, and mildly elevated LVEDP.   CORONARY CIRCULATION: The coronary circulation is right dominant. There was severe 1-vessel coronary artery disease. Left main: Normal. LAD: The vessel was normal sized and mildly calcified. Proximal LAD: There was a diffuse 20 % stenosis. Mid LAD: There was a diffuse 20 % stenosis. 1st diagonal: The vessel was normal sized. Angiography showed minor luminal irregularities. There was a 30 % stenosis in the proximal third of the vessel segment. 2nd diagonal: The vessel was normal sized. Angiography showed minor luminal irregularities. 3rd diagonal: The vessel was very small sized. Circumflex: The vessel was normal sized. Mid circumflex: There was a tubular 30 % stenosis. 1st obtuse marginal: The vessel was very small sized.  2ndobtuse marginal: The vessel was normal sized. Angiography showed  minor luminal irregularities. 3rd obtuse marginal: The vessel was normal sized. There was a 30 % stenosis in the proximal third of the vessel segment. Left AV groove artery: The vessel was medium sized. Angiography showed minor luminal irregularities. Ramus intermedius: The vessel was medium sized. Proximal ramus intermedius: There was a tubular 20 % stenosis. RCA: The vessel was normal sized. Proximal RCA: There was a 100 %stenosis. The lesion was associated with a moderate filling defect consistent with thrombus. There was TIMI grade 0 flow through the vessel (no flow). This is a likely culprit for the patient's clinical presentation. An intervention was performed. Distal RCA: There was a discrete 30 % stenosis. Right PDA: The vessel was normal sized. There was a diffuse 30 % stenosis in the middle third of the vessel segment. RV marginal 2: The vessel was normal sized. There was a 50 % stenosis at the ostium of the vessel segment.   VENTRICLES: Global left ventricular function was normal. EF estimated was 65 %. RECOMMENDATIONS: -Patient management should include aggressive medical therapy, a cardiac rehabilitation program, and smoking cessation. Recommend dual antiplatelet therapy for at least 12 months.   HISTORY: No history of previous myocardial infarction. There was no prior diagnosis of congestive heart failure. The patient has hypertension, a history of current cigarette use, and afamily history of coronary artery disease. There was no history of cerebrovascular disease, peripheral arterial disease, chronic lung disease, diabetes, or dyslipidemia. PRIOR CARDIOVASCULAR PROCEDURES: No history of valve surgery, coronary or graft percutaneous intervention, or coronary bypass surgery.   PRIOR DIAGNOSTIC TEST RESULTS: No prior stress test is available.   PROCEDURES PERFORMED: Left heart catheterization with ventriculography. Coronary Thrombectomy. Intervention on proximal RCA:  drug-eluting stent.   PROCEDURE: The risks and alternatives of the procedures and conscious sedation were explained to the patient and informed consent was obtained. The patient was brought to the cath lab and placed on the table. The planned puncture sites were prepped and draped in the usual sterile fashion.   -Right radial artery access. The vessel was accessed, a wire was threaded into the vessel, and a was advanced over the wire into the vessel.   -Left heart catheterization. A catheter was advanced to the ascending aorta. Ventriculography was performed using power injection of contrast agent.   LESION INTERVENTION: Aspiration thrombectomy followed by a drug-eluting stent was performed on the 100 % lesion in theproximal RCA. Following intervention there was a 0 % residual stenosis. This was an ACC/AHA type C "high risk" lesion for intervention. There was TIMI 0 flow before the procedure and TIMI 3 flow after the procedure. There was no acute vessel closure. There was no perforation. There was no dissection.   -Vessel setup was performed. A 6F JR 4 guiding catheter was used to intubate the vessel.   -Vessel setup was performed. A Runthrough NS 180cm wire was used to cross the lesion.   -A Unspecified was performed, using a Fetch Aspiration Device (Possis).   -A Xience EX 3.0 x 23MM drug-eluting stent was placed across the lesion and deployed at a maximum inflation pressure of 16 atm.   -Balloon angioplasty was performed, using a NCTrek 3.5 x 20mm balloon, with 1 inflation(s) and a maximum inflation pressure of 12 atm.   CARDIAC INTERVENTIONS -Coronary Thrombectomy.   PROCEDURE COMPLETION: TIMING: Test started at 13:15. Test concluded at 13:55. RADIATION EXPOSURE: Fluoroscopy time: 7.03 min. MEDICATIONS GIVEN: Fentanyl, 50 mcg, IV, at   13:19. Midazolam, 1 mg, IV, at 13:19. Verapamil (Isoptin, Calan, Covera), 2.5 mcg, intracoronary, at 13:21. Nitroglycerin, 200 mcg,  IV, at 13:24. Nitroglycerin, 200 mcg, IV, at 13:42. Angiomax Bolus, 13.4 ml, IV, at 13:26. Angiomax Drip, 31.4 ml, IV, at 13:32. Effiant, 60 mg, PO, at 13:33. .9% Normal Saline, 250 ml, IV, at 13:42. CONTRAST GIVEN: Isovue 160 ml.   Prepared and signed by   Kathlyn Sacramento, MD Signed 12/18/2012 17:47:39   STUDY DIAGRAM   Angiographic findings Native coronary lesions:  Proximal LAD: Lesion 1: diffuse, 20 % stenosis.  Mid LAD: Lesion 1: diffuse, 20 % stenosis.  D1: Lesion 1: 30 % stenosis.  Mid circumflex: Lesion 1: tubular, 30 % stenosis.  OM3: Lesion 1: 30 % stenosis.  Proximal RI: Lesion 1: tubular, 20 % stenosis.  Proximal RCA: Lesion 1: 100 % stenosis.  Distal RCA: Lesion 1: discrete, 30 % stenosis.  RPDA: Lesion 1: diffuse, 30 % stenosis.  RV marginal 2: Lesion 1: 50 % stenosis. Intervention results Native coronary lesions: drug-eluting stent of the 100 % stenosis in proximal RCA. 0 % residual stenosis. Stent: Xience EX 3.0 x 23MM drug-eluting.   HEMODYNAMIC TABLES   Pressures:  Baseline Pressures:  - HR: 67 Pressures:  - Rhythm: Pressures:  -- Aortic Pressure (S/D/M): 134/73/99 Pressures:  -- Left Ventricle (s/edp): 133/17/--   Outputs:  Baseline Outputs:  -- CALCULATIONS[age] Outputs:  -- CALCULATIONS: Body Surface Area: 2.05 Outputs:  -- CALCULATIONS: Height in cm: 175.00 Outputs:  -- CALCULATIONS: Sex: Male Outputs:  -- CALCULATIONS: Weight in kg: 89.80  Cardiology:  17-Jun-14 06:40   Ventricular Rate 60  Atrial Rate 60  P-R Interval 188  QRS Duration 88  QT 430  QTc 430  P Axis 60  R Axis -45  T Axis -42  ECG interpretation Normal sinus rhythm Left anterior fascicular block Septal infarct , age undetermined Abnormal ECG When compared with ECG of 18-Dec-2012 12:51, ST now depressed in Inferior leads ST no longer depressed in Anterolateral leads T wave inversion now evident in Inferior leads T wave inversion less evident in Lateral  leads ----------unconfirmed---------- Confirmed by OVERREAD, NOT ([id number]), editor PEARSON, BARBARA ([id number]) on 12/19/2012 8:50:31 AM  Routine Chem:  16-Jun-14 13:00   Glucose, Serum  114  BUN 10  Creatinine (comp) 1.10  Sodium, Serum  135  Potassium, Serum 4.1  Chloride, Serum 103  CO2, Serum 25  Calcium (Total), Serum 9.4  Anion Gap 7  Osmolality (calc) 270  eGFR (African American) >60  eGFR (Non-African American) >60 (eGFR values <34m/min/1.73 m2 may be an indication of chronic kidney disease (CKD). Calculated eGFR is useful in patients with stable renal function. The eGFR calculation will not be reliable in acutely ill patients when serum creatinine is changing rapidly. It is not useful in  patients on dialysis. The eGFR calculation may not be applicable to patients at the low and high extremes of body sizes, pregnant women, and vegetarians.)    20:57   Result Comment troponin - Renee Babb at 2148 12-18-12 JB.  - READ-BACK PROCESS PERFORMED.  - RESULTS VERIFIED BY REPEAT TESTING.  Result(s) reported on 18 Dec 2012 at 09:50PM.  17-Jun-14 06:01   Result Comment TROPONIN - RESULTS VERIFIED BY REPEAT TESTING.  - PREVIOUS CALL:12/18/12 AT 2148..Marland KitchenPL  Result(s) reported on 19 Dec 2012 at 07:21AM.  Cholesterol, Serum  212  Triglycerides, Serum 142  HDL (INHOUSE)  31  VLDL Cholesterol Calculated 28  LDL Cholesterol Calculated  153 (Result(s) reported on 19 Dec 2012 at 06:34AM.)  Glucose, Serum 93  BUN 9  Creatinine (comp) 0.98  Sodium, Serum 136  Potassium, Serum 4.1  Chloride, Serum 106  CO2, Serum 25  Calcium (Total), Serum 8.8  Anion Gap  5  Osmolality (calc) 270  eGFR (African American) >60  eGFR (Non-African American) >60 (eGFR values <62m/min/1.73 m2 may be an indication of chronic kidney disease (CKD). Calculated eGFR is useful in patients with stable renal function. The eGFR calculation will not be reliable in acutely ill patients when serum creatinine is changing  rapidly. It is not useful in  patients on dialysis. The eGFR calculation may not be applicable to patients at the low and high extremes of body sizes, pregnant women, and vegetarians.)  Cardiac:  16-Jun-14 13:00   CK, Total 164  CPK-MB, Serum 1.1 (Result(s) reported on 18 Dec 2012 at 01:45PM.)  Troponin I < 0.02 (0.00-0.05 0.05 ng/mL or less: NEGATIVE  Repeat testing in 3-6 hrs  if clinically indicated. >0.05 ng/mL: POTENTIAL  MYOCARDIAL INJURY. Repeat  testing in 3-6 hrs if  clinically indicated. NOTE: An increase or decrease  of 30% or more on serial  testing suggests a  clinically important change)    20:57   CK, Total 219  CPK-MB, Serum  8.5 (Result(s) reported on 18 Dec 2012 at 09:41PM.)  Troponin I  5.80 (0.00-0.05 0.05 ng/mL or less: NEGATIVE  Repeat testing in 3-6 hrs  if clinically indicated. >0.05 ng/mL: POTENTIAL  MYOCARDIAL INJURY. Repeat  testing in 3-6 hrs if  clinically indicated. NOTE: An increase or decrease  of 30% or more on serial  testing suggests a  clinically important change)  17-Jun-14 06:01   CK, Total 201  CPK-MB, Serum  7.0 (Result(s) reported on 19 Dec 2012 at 06:48AM.)  Troponin I  3.30 (0.00-0.05 0.05 ng/mL or less: NEGATIVE  Repeat testing in 3-6 hrs  if clinically indicated. >0.05 ng/mL: POTENTIAL  MYOCARDIAL INJURY. Repeat  testing in 3-6 hrs if  clinically indicated. NOTE: An increase or decrease  of 30% or more on serial  testing suggests a  clinically important change)  Routine Coag:  16-Jun-14 13:00   Prothrombin 12.9  INR 1.0 (INR reference interval applies to patients on anticoagulant therapy. A single INR therapeutic range for coumarins is not optimal for all indications; however, the suggested range for most indications is 2.0 - 3.0. Exceptions to the INR Reference Range may include: Prosthetic heart valves, acute myocardial infarction, prevention of myocardial infarction, and combinations of aspirin and  anticoagulant. The need for a higher or lower target INR must be assessed individually. Reference: The Pharmacology and Management of the Vitamin K  antagonists: the seventh ACCP Conference on Antithrombotic and Thrombolytic Therapy. COIZTI.4580Sept:126 (3suppl): [id number] A HCT value >55% may artifactually increase the PT.  In one study,  the increase was an average of 25%. Reference:  "Effect on Routine and Special Coagulation Testing Values of Citrate Anticoagulant Adjustment in Patients with High HCT Values." American Journal of Clinical Pathology 2006;126:400-405.)  Routine Hem:  16-Jun-14 13:00   WBC (CBC) 7.4  RBC (CBC) 5.85  Hemoglobin (CBC) 18.0  Hematocrit (CBC) 52.0  Platelet Count (CBC) 270 (Result(s) reported on 18 Dec 2012 at 01:15PM.)  MCV 89  MCH 30.7  MCHC 34.5  RDW  14.9   PERTINENT RADIOLOGY STUDIES:  XRay:    16-Jun-14 14:39, Chest Portable Single View  Chest Portable Single View   REASON FOR EXAM:  chest pain  COMMENTS:       PROCEDURE: DXR - DXR PORTABLE CHEST SINGLE VIEW  - Dec 18 2012  2:39PM     RESULT: The lungs are clear. The cardiac silhouette and visualized bony   skeleton are unremarkable.    IMPRESSION:      1. Chest radiograph without evidence of acute cardiopulmonary disease.   2. Comparison made to prior study dated 10/02/2011.        Verified By: Mikki Santee, M.D., MD   Hospital Course:  Hospital Course He was prepped and access obtained via the R radial artery. This revealed a 100% prox RCA stenosis s/p successful DES placement; EF 65% (see full details below). The patient tolerated the procedure well without complications. The recommendation was made to pursue DAPT- ASA/Effient x 12 months. High-dose atorvastatin, lisinopril and NTG SL PRN were added. Outpatient Lopressor was continued. He was transferred to CCU in good condition.   He remained stable overnight and was transferred to telemetry. As expected, TnI did elevate to  5.80. From a risk stratification standpoint, lipid panel revealed LDL 153, HDL 31, TG 142, TC 212. Blood sugars remained WNL lowering suspicion for undiagnosed DM2. He denied recurrent chest discomfort or shortness of breath. He ambulated well with cardiac rehab without incident. He was evaluated by Dr. Fletcher Anon this morning who deemed him stable for discharge. He will be discharged on the medication regimen outlined below. Case management has been consulted to assist with affordability. Arrangements will be completed for this. He will follow-up with Dr. Fletcher Anon </= 7 days as part of transitional care. This information, including post-cath instructions and activity restrictions, has been discussed with the patient and outlined in the discharge instructions.   Condition on Discharge [name] MEDS:  Medication Reconciliation: Patient's Home Medications at Discharge:     Medication Instructions  lisinopril 20 mg oral tablet  1 tab(s) orally once a day   nitroglycerin 0.4 mg sublingual tablet  1 tab(s) sublingual , As needed, chest pain   atorvastatin 80 mg oral tablet  1 tab(s) orally once a day (at bedtime)   aspirin 81 mg oral delayed release tablet  1 tab(s) orally once a day   metoprolol tartrate 25 mg oral tablet  1 tab(s) orally every 12 hours   prasugrel 10 mg oral tablet  1 tab(s) orally once a day     Physician's Instructions:  Diet Low Sodium  Low Fat, Low Cholesterol   Activity Limitations No exertional activity  No heavy lifting  x 7 days. No lifting > 5 lbs during this time.   Return to Work after follow up visit with MD   Time frame for Follow Up Appointment 1-2 weeks   Electronic Signatures: Meriel Pica (PA-C)   (Signed 18-Jun-14 11:16)  Authored: ADMISSION DATE AND DIAGNOSIS, CHIEF COMPLAINT/HPI, PERTINENT LABS, [facility], PATIENT INSTRUCTIONS Kathlyn Sacramento (MD)   (Signed  18-Jun-14 11:45)  Co-Signer: ADMISSION DATE AND DIAGNOSIS, CHIEF COMPLAINT/HPI, HOSPITAL COURSE, DISCHARGE INSTRUCTIONS HOME MEDS, PATIENT INSTRUCTIONS  Last Updated: 18-Jun-14 11:45 by Kathlyn Sacramento (MD)

## 2014-11-11 ENCOUNTER — Other Ambulatory Visit: Payer: Self-pay | Admitting: Cardiovascular Disease

## 2014-11-25 ENCOUNTER — Ambulatory Visit (INDEPENDENT_AMBULATORY_CARE_PROVIDER_SITE_OTHER): Payer: Medicaid Other | Admitting: Cardiovascular Disease

## 2014-11-25 ENCOUNTER — Encounter: Payer: Self-pay | Admitting: Cardiovascular Disease

## 2014-11-25 VITALS — BP 130/88 | HR 65 | Ht 70.5 in | Wt 211.0 lb

## 2014-11-25 DIAGNOSIS — R079 Chest pain, unspecified: Secondary | ICD-10-CM

## 2014-11-25 DIAGNOSIS — E785 Hyperlipidemia, unspecified: Secondary | ICD-10-CM

## 2014-11-25 DIAGNOSIS — I2510 Atherosclerotic heart disease of native coronary artery without angina pectoris: Secondary | ICD-10-CM | POA: Diagnosis not present

## 2014-11-25 DIAGNOSIS — I10 Essential (primary) hypertension: Secondary | ICD-10-CM | POA: Diagnosis not present

## 2014-11-25 NOTE — Assessment & Plan Note (Signed)
Blood pressure is well controlled on current medications. 

## 2014-11-25 NOTE — Assessment & Plan Note (Signed)
He is doing very well with no convincing symptoms of angina. Continue medical therapy. I discussed with him the importance of lifestyle changes including regular exercise and weight loss. I will likely discontinue treatment with Plavix in the next 6 months.

## 2014-11-25 NOTE — Patient Instructions (Signed)
Medication Instructions:  Your physician recommends that you continue on your current medications as directed. Please refer to the Current Medication list given to you today.   Labwork: Your physician recommends that you have fasting liver and lipid profile today   Testing/Procedures: None  Follow-Up: Your physician wants you to follow-up in: [place] with Dr. Zannie Cove will receive a reminder letter in the mail two months in advance. If you don't receive a letter, please call our office to schedule the follow-up appointment.  Establish care with PCP   Any Other Special Instructions Will Be Listed Below (If Applicable).

## 2014-11-25 NOTE — Assessment & Plan Note (Signed)
I requested fasting lipid and liver profile today.

## 2014-11-25 NOTE — Progress Notes (Signed)
HPI  This is a 59 year old male who is here today for a followup visit. He presented in 12/2012 to [facility] with acute inferior ST elevation myocardial infarction. He underwent cardiac catheterization which showed an occluded proximal RCA with mild disease in the left circumflex and LAD. He underwent successful thrombectomy and drug-eluting stent placement without complications. Ejection fraction was normal.  He quit smoking since that event. He did have elevated liver enzymes on atorvastatin and thus the dose was gradually decreased to 10 mg once daily. Lower extremity cramping resolved. He had exertional chest pain in April of 2015. He underwent a nuclear stress test which showed no evidence of ischemia with normal ejection fraction. He reports chest pain when he is under stress but overall has been doing well. He has no myalgia. He has gained some weight and has not been exercising on a regular basis.    No Known Allergies   Current Outpatient Prescriptions on File Prior to Visit  Medication Sig Dispense Refill  . aspirin 81 MG tablet Take 81 mg by mouth daily.    [name] atorvastatin (LIPITOR) 10 MG tablet TAKE ONE (1) TABLET EACH DAY 30 tablet 3  . clopidogrel (PLAVIX) 75 MG tablet TAKE ONE (1) TABLET EACH DAY 30 tablet 6  . lisinopril (PRINIVIL,ZESTRIL) 40 MG tablet TAKE ONE (1) TABLET EACH DAY 30 tablet 3  . metoprolol tartrate (LOPRESSOR) 25 MG tablet Take 1 tablet (25 mg total) by mouth every 12 (twelve) hours. 60 tablet 6  . nitroGLYCERIN (NITROSTAT) 0.4 MG SL tablet Place 0.4 mg under the tongue every 5 (five) minutes as needed for chest pain.     No current facility-administered medications on file prior to visit.     Past Medical History  Diagnosis Date  . Stomach ulcer   . MI (myocardial infarction)   . Coronary artery disease 06 / 2014    Acute inferior ST elevation myocardial infarction. Cardiac catheterization showed an occluded proximal RCA with mild LAD/left circumflex  disease. Successful thrombectomy and drug-eluting stent placement to the proximal RCA. Ejection fraction was normal.  . Hyperlipidemia   . Hypertension      Past Surgical History  Procedure Laterality Date  . Hernia repair    . Appendectomy    . Cardiac catheterization  6/14    [facility];EF 65%     Family History  Problem Relation Age of Onset  . Heart failure Father   . Heart disease Father      History   Social History  . Marital Status: Married    Spouse Name: [name]  . Number of Children: [name]  . Years of Education: [name]   Occupational History  . Not on file.   Social History Main Topics  . Smoking status: Former Smoker -- 1.00 packs/day for 30 years    Types: Cigarettes    Quit date: 12/18/2012  . Smokeless tobacco: Not on file  . Alcohol Use: No  . Drug Use: No     Comment: past marijuana  . Sexual Activity: Not on file   Other Topics Concern  . Not on file   Social History Narrative     PHYSICAL EXAM   BP 130/88 mmHg  Pulse 65  Ht 5' 10.5" (1.791 m)  Wt 211 lb (95.709 kg)  BMI 29.84 kg/m2 Constitutional: He is oriented to person, place, and time. He appears well-developed and well-nourished. No distress.  HENT: No nasal discharge.  Head: Normocephalic and atraumatic.  Eyes: Pupils are equal  and round. Right eye exhibits no discharge. Left eye exhibits no discharge.  Neck: Normal range of motion. Neck supple. No JVD present. No thyromegaly present.  Cardiovascular: Normal rate, regular rhythm, normal heart sounds and. Exam reveals no gallop and no friction rub. There is a 2/6 systolic ejection murmur in the aortic area  Pulmonary/Chest: Effort normal and breath sounds normal. No stridor. No respiratory distress. He has no wheezes. He has no rales. He exhibits no tenderness.  Abdominal: Soft. Bowel sounds are normal. He exhibits no distension. There is no tenderness. There is no rebound and no guarding.  Musculoskeletal: Normal range of motion. He exhibits  no edema and no tenderness.  Neurological: He is alert and oriented to person, place, and time. Coordination normal.  Skin: Skin is warm and dry. No rash noted. He is not diaphoretic. No erythema. No pallor.  Psychiatric: He has a normal mood and affect. His behavior is normal. Judgment and thought content normal.  Vascular: Femoral pulses are normal.     EKG: Sinus  Rhythm  -Left axis -anterior fascicular block.   -  Nonspecific T-abnormality.   ABNORMAL     ASSESSMENT AND PLAN

## 2014-11-26 LAB — LIPID PANEL
CHOL/HDL RATIO: 3.8 ratio (ref 0.0–5.0)
Cholesterol, Total: 162 mg/dL (ref 100–199)
HDL: 43 mg/dL (ref >39)
LDL CALC: 101 mg/dL — AB (ref 0–99)
Triglycerides: 90 mg/dL (ref 0–149)
VLDL CHOLESTEROL CAL: 18 mg/dL (ref 5–40)

## 2014-11-26 LAB — HEPATIC FUNCTION PANEL
ALK PHOS: 49 IU/L (ref 39–117)
ALT: 38 IU/L (ref 0–44)
AST: 35 IU/L (ref 0–40)
Albumin: 4.5 g/dL (ref 3.5–5.5)
Bilirubin Total: 0.9 mg/dL (ref 0.0–1.2)
Bilirubin, Direct: 0.19 mg/dL (ref 0.00–0.40)
TOTAL PROTEIN: 7.3 g/dL (ref 6.0–8.5)

## 2014-12-12 ENCOUNTER — Other Ambulatory Visit: Payer: Self-pay | Admitting: Cardiovascular Disease

## 2014-12-27 ENCOUNTER — Other Ambulatory Visit: Payer: Self-pay | Admitting: Cardiovascular Disease

## 2015-02-02 ENCOUNTER — Emergency Department
Admission: EM | Admit: 2015-02-02 | Discharge: 2015-02-02 | Disposition: A | Discharge status: Home / Self Care | Payer: Medicaid Other | Attending: Emergency Medicine | Admitting: Emergency Medicine

## 2015-02-02 ENCOUNTER — Emergency Department: Payer: Medicaid Other

## 2015-02-02 ENCOUNTER — Encounter: Payer: Self-pay | Admitting: Emergency Medicine

## 2015-02-02 DIAGNOSIS — Z87891 Personal history of nicotine dependence: Secondary | ICD-10-CM | POA: Insufficient documentation

## 2015-02-02 DIAGNOSIS — R55 Syncope and collapse: Secondary | ICD-10-CM | POA: Insufficient documentation

## 2015-02-02 DIAGNOSIS — I10 Essential (primary) hypertension: Secondary | ICD-10-CM | POA: Diagnosis not present

## 2015-02-02 DIAGNOSIS — R61 Generalized hyperhidrosis: Secondary | ICD-10-CM | POA: Insufficient documentation

## 2015-02-02 LAB — COMPREHENSIVE METABOLIC PANEL
ALBUMIN: 4 g/dL (ref 3.5–5.0)
ALT: 32 U/L (ref 17–63)
ANION GAP: 6 (ref 5–15)
AST: 31 U/L (ref 15–41)
Alkaline Phosphatase: 45 U/L (ref 38–126)
BUN: 12 mg/dL (ref 6–20)
CALCIUM: 8.8 mg/dL — AB (ref 8.9–10.3)
CO2: 27 mmol/L (ref 22–32)
CREATININE: 1.08 mg/dL (ref 0.61–1.24)
Chloride: 108 mmol/L (ref 101–111)
GFR calc Af Amer: >60 mL/min (ref >60)
GFR calc non Af Amer: >60 mL/min (ref >60)
Glucose, Bld: 109 mg/dL — ABNORMAL HIGH (ref 65–99)
Potassium: 3.6 mmol/L (ref 3.5–5.1)
Sodium: 141 mmol/L (ref 135–145)
Total Bilirubin: 1.4 mg/dL — ABNORMAL HIGH (ref 0.3–1.2)
Total Protein: 7.4 g/dL (ref 6.5–8.1)

## 2015-02-02 LAB — TROPONIN I: Troponin I: <0.03 ng/mL (ref <0.031)

## 2015-02-02 LAB — PROTIME-INR
INR: 1.02
Prothrombin Time: 13.6 seconds (ref 11.4–15.0)

## 2015-02-02 LAB — CBC
HEMATOCRIT: 45.1 % (ref 40.0–52.0)
Hemoglobin: 15.5 g/dL (ref 13.0–18.0)
MCH: 30.4 pg (ref 26.0–34.0)
MCHC: 34.4 g/dL (ref 32.0–36.0)
MCV: 88.2 fL (ref 80.0–100.0)
Platelets: 221 10*3/uL (ref 150–440)
RBC: 5.11 MIL/uL (ref 4.40–5.90)
RDW: 14.2 % (ref 11.5–14.5)
WBC: 6.4 10*3/uL (ref 3.8–10.6)

## 2015-02-02 NOTE — ED Notes (Signed)
Pt presents to the ER from home with complaints of near syncope episode and diaphoretic. Pt reports he felt lightheaded and had to sat down at home, pt reports he was diaphoretic for 15 minutes, reports history of massive MI with similar symptoms in the pass. Pt talks in complete sentences no respiratory distress noted.

## 2015-02-02 NOTE — Discharge Instructions (Signed)
Near-Syncope Near-syncope (commonly known as near fainting) is sudden weakness, dizziness, or feeling like you might pass out. During an episode of near-syncope, you may also develop pale skin, have tunnel vision, or feel sick to your stomach (nauseous). Near-syncope may occur when getting up after sitting or while standing for a long time. It is caused by a sudden decrease in blood flow to the brain. This decrease can result from various causes or triggers, most of which are not serious. However, because near-syncope can sometimes be a sign of something serious, a medical evaluation is required. The specific cause is often not determined. HOME CARE INSTRUCTIONS  Monitor your condition for any changes. The following actions may help to alleviate any discomfort you are experiencing:  Have someone stay with you until you feel stable.  Lie down right away and prop your feet up if you start feeling like you might faint. Breathe deeply and steadily. Wait until all the symptoms have passed. Most of these episodes last only a few minutes. You may feel tired for several hours.   Drink enough fluids to keep your urine clear or pale yellow.   If you are taking blood pressure or heart medicine, get up slowly when seated or lying down. Take several minutes to sit and then stand. This can reduce dizziness.  Follow up with your health care provider as directed. SEEK IMMEDIATE MEDICAL CARE IF:   You have a severe headache.   You have unusual pain in the chest, abdomen, or back.   You are bleeding from the mouth or rectum, or you have black or tarry stool.   You have an irregular or very fast heartbeat.   You have repeated fainting or have seizure-like jerking during an episode.   You faint when sitting or lying down.   You have confusion.   You have difficulty walking.   You have severe weakness.   You have vision problems.  MAKE SURE YOU:   Understand these instructions.  Will  watch your condition.  Will get help right away if you are not doing well or get worse. Document Released: 06/21/2005 Document Revised: 06/26/2013 Document Reviewed: 11/24/2012 ExitCare Patient Information 2015 [employer]. This information is not intended to replace advice given to you by your health care provider. Make sure you discuss any questions you have with your health care provider.  

## 2015-02-02 NOTE — ED Notes (Signed)
Pt states awoke from a nap this afternoon, stood up and began to feel faint. Pt states he sat down then began to "sweat profusely". Pt states "i felt like i had butterflies in my stomach."pt denies vomiting, known fever, chills, pain. Pt denies shortness of breath. Pt states "i had a heart attack in 2014 and i was sweating really bad then, so i decided to come on in and make sure i wasn't having a heart attack again." pt denies feeling near syncopal currently. Pt denies nausea currently, denies pain currently.

## 2015-02-02 NOTE — ED Provider Notes (Signed)
[facility] Emergency Department Provider Note     Time seen: ----------------------------------------- 10:11 PM on 02/02/2015 -----------------------------------------    I have reviewed the triage vital signs and the nursing notes.   HISTORY  Chief Complaint Near Syncope    HPI [name] is a 59 y.o. male who presents ER after he had a near-syncopal event. Patient states he woke up from a nap this afternoon still up and began to feel faint. She then began to feel profusely diaphoretic like he did when he had a heart attack. He does not have any chest pain. He has not had fevers, chills, chest pain, shortness of breath, nausea vomiting or diarrhea. Patient states after a while his symptoms resolved. Denies any complaints at all. This presents for evaluation due to the similarity of when he had a heart attack.   Past Medical History  Diagnosis Date  . Stomach ulcer   . MI (myocardial infarction)   . Coronary artery disease 06 / 2014    Acute inferior ST elevation myocardial infarction. Cardiac catheterization showed an occluded proximal RCA with mild LAD/left circumflex disease. Successful thrombectomy and drug-eluting stent placement to the proximal RCA. Ejection fraction was normal.  . Hyperlipidemia   . Hypertension     Patient Active Problem List   Diagnosis Date Noted  . Cardiac murmur 05/23/2014  . Hyperlipidemia   . Hypertension   . Coronary artery disease 12/03/2012    Past Surgical History  Procedure Laterality Date  . Hernia repair    . Appendectomy    . Cardiac catheterization  6/14    [facility];EF 65%    Allergies Review of patient's allergies indicates no known allergies.  Social History History  Substance Use Topics  . Smoking status: Former Smoker -- 1.00 packs/day for 30 years    Types: Cigarettes    Quit date: 12/18/2012  . Smokeless tobacco: Not on file  . Alcohol Use: Not on file    Review of  Systems Constitutional: Negative for fever. Eyes: Negative for visual changes. ENT: Negative for sore throat. Cardiovascular: Negative for chest pain. Positive for near syncope Respiratory: Negative for shortness of breath. Gastrointestinal: Negative for abdominal pain, vomiting and diarrhea. Genitourinary: Negative for dysuria. Musculoskeletal: Negative for back pain. Skin: Positive for diaphoresis Neurological: Negative for headaches, focal weakness or numbness.  10-point ROS otherwise negative.  ____________________________________________   PHYSICAL EXAM:  VITAL SIGNS: ED Triage Vitals  Enc Vitals Group     BP 02/02/15 1754 140/69 mmHg     Pulse Rate 02/02/15 1754 73     Resp 02/02/15 1754 20     Temp 02/02/15 1754 98 F (36.7 C)     Temp Source 02/02/15 1754 Oral     SpO2 02/02/15 1754 94 %     Weight 02/02/15 1754 216 lb (97.977 kg)     Height 02/02/15 1754 5\' 10"  (1.778 m)     Head Cir --      Peak Flow --      Pain Score 02/02/15 1755 0     Pain Loc --      Pain Edu? --      Excl. in [place]? --     Constitutional: Alert and oriented. Well appearing and in no distress. Eyes: Conjunctivae are normal. PERRL. Normal extraocular movements. ENT   Head: Normocephalic and atraumatic.   Nose: No congestion/rhinnorhea.   Mouth/Throat: Mucous membranes are moist.   Neck: No stridor. Cardiovascular: Normal rate, regular rhythm. Normal  and symmetric distal pulses are present in all extremities. No murmurs, rubs, or gallops. Respiratory: Normal respiratory effort without tachypnea nor retractions. Breath sounds are clear and equal bilaterally. No wheezes/rales/rhonchi. Gastrointestinal: Soft and nontender. No distention. No abdominal bruits.  Musculoskeletal: Nontender with normal range of motion in all extremities. No joint effusions.  No lower extremity tenderness nor edema. Neurologic:  Normal speech and language. No gross focal neurologic deficits are  appreciated. Speech is normal. No gait instability. Skin:  Skin is warm, dry and intact. No rash noted. Psychiatric: Mood and affect are normal. Speech and behavior are normal. Patient exhibits appropriate insight and judgment. ____________________________________________  EKG: Interpreted by me. Normal sinus rhythm with left anterior fascicular block, rate is 76 bpm, normal QRS with, normal QT interval. No evidence of acute infarction.  ____________________________________________  ED COURSE:  Pertinent labs & imaging results that were available during my care of the patient were reviewed by me and considered in my medical decision making (see chart for details). Patient need cardiac workup here possible repeat troponin. ____________________________________________    LABS (pertinent positives/negatives)  Labs Reviewed  COMPREHENSIVE METABOLIC PANEL - Abnormal; Notable for the following:    Glucose, Bld 109 (*)    Calcium 8.8 (*)    Total Bilirubin 1.4 (*)    All other components within normal limits  CBC  PROTIME-INR  TROPONIN I  TROPONIN I  I-STAT TROPOININ, ED    RADIOLOGY Images were viewed by me  Chest x-ray is unremarkable  FINAL ASSESSMENT AND PLAN  Near syncope, diaphoresis  Plan: Patient with labs and imaging as dictated above. Patient with negative troponin 2, states he feels fine currently. No other signs to indicate acute coronary syndrome. He is stable for outpatient follow-up with his doctor   Earleen Newport, MD   Earleen Newport, MD 02/03/15 2233

## 2015-03-25 ENCOUNTER — Telehealth: Payer: Self-pay

## 2015-03-25 NOTE — Telephone Encounter (Signed)
Received records request from [employer], forwarded to [facility] for processing.

## 2015-04-04 ENCOUNTER — Telehealth: Payer: Self-pay

## 2015-04-04 NOTE — Telephone Encounter (Signed)
Received records request Disability Determination Services, forwarded to [employer] for processing.  

## 2015-04-17 ENCOUNTER — Telehealth: Payer: Self-pay

## 2015-04-17 NOTE — Telephone Encounter (Signed)
Received records request DISABILITY DETERMINATION SERVICES, forwarded to [employer] for processing.  

## 2015-05-02 ENCOUNTER — Other Ambulatory Visit: Payer: Self-pay | Admitting: *Deleted

## 2015-05-02 MED ORDER — LISINOPRIL 40 MG PO TABS: ORAL_TABLET | ORAL | Status: DC

## 2015-05-15 ENCOUNTER — Other Ambulatory Visit: Payer: Self-pay | Admitting: Cardiovascular Disease

## 2015-05-15 MED ORDER — ATORVASTATIN CALCIUM 10 MG PO TABS: ORAL_TABLET | ORAL | Status: DC

## 2015-05-27 ENCOUNTER — Ambulatory Visit (INDEPENDENT_AMBULATORY_CARE_PROVIDER_SITE_OTHER): Payer: Medicaid Other | Admitting: Nurse Practitioner

## 2015-05-27 ENCOUNTER — Encounter: Payer: Self-pay | Admitting: Nurse Practitioner

## 2015-05-27 VITALS — BP 177/110 | HR 70 | Ht 70.5 in | Wt 218.0 lb

## 2015-05-27 DIAGNOSIS — I10 Essential (primary) hypertension: Secondary | ICD-10-CM | POA: Diagnosis not present

## 2015-05-27 DIAGNOSIS — R55 Syncope and collapse: Secondary | ICD-10-CM | POA: Insufficient documentation

## 2015-05-27 DIAGNOSIS — E785 Hyperlipidemia, unspecified: Secondary | ICD-10-CM

## 2015-05-27 DIAGNOSIS — I25119 Atherosclerotic heart disease of native coronary artery with unspecified angina pectoris: Secondary | ICD-10-CM

## 2015-05-27 NOTE — Patient Instructions (Signed)
Medication Instructions:  Your physician recommends that you continue on your current medications as directed. Please refer to the Current Medication list given to you today.   Labwork: none  Testing/Procedures: none  Follow-Up: Your physician recommends that you schedule a follow-up appointment in: six months with Dr. Fletcher Anon.    Any Other Special Instructions Will Be Listed Below (If Applicable).     If you need a refill on your cardiac medications before your next appointment, please call your pharmacy.

## 2015-05-27 NOTE — Progress Notes (Signed)
Patient Name: [name] Date of Encounter: 05/27/2015  Primary Care Provider:  Morton Peters, MD Primary Cardiologist:  Jerilynn Mages. Fletcher Anon, MD   Chief Complaint  59 y/o male with a h/o CAD s/p inf MI in 2014 who presents for f/u.  Past Medical History   Past Medical History  Diagnosis Date  . Stomach ulcer   . Coronary artery disease     a. 12/2012 Acute inferior STEMI/PCI: occluded proximal RCA with mild LAD/left circumflex disease. Successful thrombectomy and drug-eluting stent placement to the proximal RCA. Ejection fraction was normal.  . Hyperlipidemia   . Essential hypertension   . Near syncope     a. ER visit 01/2015 - ? etiology.   Past Surgical History  Procedure Laterality Date  . Hernia repair    . Appendectomy    . Cardiac catheterization  6/14    [facility];EF 65%    Allergies  No Known Allergies  HPI  59 y/o male with the above PMH.  He is s/p inferior Mi in 2014 with PCI/DES to the RCA @ that time.  Since then, he has continued to have intermittent, mild, left sided chest tightness w/o assoc Ss, most often occurring w/ stress up to 3x/wk, lasting about 30-60 mins and resolving spontaneously.  He has not had any change in the severity or frequency of this symptom. He did have a stress test in April 2015, which was negative. He is not very active and says that he does express fatigue and dyspnea with exertion but believes he is deconditioned. Over the past month, he is noted occasional expiratory wheezing. He was previously smoker but quit in 2014.  In July, he was in the ER for complaints of near syncope. Workup was unrevealing at that time. He has had no recurrence. He denies PND, orthopnea, dizziness, syncope, edema, or early satiety.  Home Medications  Prior to Admission medications   Medication Sig Start Date End Date Taking? Authorizing Provider  aspirin 81 MG tablet Take 81 mg by mouth daily.   Yes Historical Provider, MD  atorvastatin (LIPITOR) 10 MG  tablet TAKE ONE (1) TABLET EACH DAY 05/15/15  Yes Wellington Hampshire, MD  clopidogrel (PLAVIX) 75 MG tablet TAKE ONE (1) TABLET EACH DAY 12/12/14  Yes Wellington Hampshire, MD  lisinopril (PRINIVIL,ZESTRIL) 40 MG tablet TAKE ONE (1) TABLET EACH DAY 05/02/15  Yes Wellington Hampshire, MD  metoprolol tartrate (LOPRESSOR) 25 MG tablet TAKE ONE TABLET EVERY 12 HOURS 12/12/14  Yes Wellington Hampshire, MD  nitroGLYCERIN (NITROSTAT) 0.4 MG SL tablet Place 0.4 mg under the tongue every 5 (five) minutes as needed for chest pain.   Yes Historical Provider, MD    Review of Systems  As above, he has chronic dyspnea exertion and intermittent left-sided chest tightness which is stable. Over the past 4-6 weeks, he has noted intermittent inspiratory and expiratory wheezing. He denies PND, orthopnea, dizziness, syncope, edema, or early satiety.  All other systems reviewed and are otherwise negative except as noted above.  Physical Exam  VS:  BP 177/80 mmHg  Pulse 70  Ht 5' 10.5" (1.791 m)  Wt 218 lb (98.884 kg)  BMI 30.83 kg/m2  SpO2 97% , BMI Body mass index is 30.83 kg/(m^2). GEN: Well nourished, well developed, in no acute distress. HEENT: normal. Neck: Supple, no JVD, carotid bruits, or masses. Cardiac: RRR, no murmurs, rubs, or gallops. No clubbing, cyanosis, edema.  Radials/DP/PT 2+ and equal bilaterally.  Respiratory:  Respirations  regular and unlabored, clear to auscultation bilaterally. GI: Soft, nontender, nondistended, BS + x 4. MS: no deformity or atrophy. Skin: warm and dry, no rash. Neuro:  Strength and sensation are intact. Psych: Normal affect.  Accessory Clinical Findings  ECG - radial sinus rhythm, 71, left axis deviation, left anterior fascicular block, prior septal infarct-no acute ST or T changes.  Assessment & Plan  1.  Coronary artery disease: Status post inferior MI with RCA stenting in 2014. Ever since his MI, he has had intermittent mild chest tightness without associated symptoms  occurring 1-3 times per week. He underwent stress testing in April 2015 which was normal. He has not noticed any increase in frequency, severity, or duration of symptoms and is not interested in any further noninvasive evaluation at this time. He remains on aspirin, statin, Plavix, beta blocker, and ACE inhibitor therapy.  2. Essential hypertension: Blood pressure is elevated today. He has not yet taken his morning medications. He does not have a way of checking his blood pressure at home. Transportation is an issue and thus is not able to check it at a local pharmacy. Advised that he go home and take his morning medications.  3. Hyperlipidemia: He is on low-dose statin therapy. LDL of 101 in May 2016.  4. Intermittent wheezing: He is not wheezing today. Does have a history of tobacco abuse with hyperinflation noted on chest x-ray in July. I offered him a rescue inhaler and discussed possible side effects include palpitations. He wished to defer at this time and will follow up with his primary care provider.  5. Disposition: Follow-up with Dr. Fletcher Anon in 6 months or sooner if necessary.    Murray Hodgkins, NP 05/27/2015, 8:22 AM

## 2015-07-06 DIAGNOSIS — I776 Arteritis, unspecified: Secondary | ICD-10-CM

## 2015-07-06 HISTORY — DX: Arteritis, unspecified: I77.6

## 2015-07-11 ENCOUNTER — Other Ambulatory Visit: Payer: Self-pay | Admitting: *Deleted

## 2015-07-11 MED ORDER — CLOPIDOGREL BISULFATE 75 MG PO TABS: ORAL_TABLET | ORAL | Status: DC

## 2015-07-11 NOTE — Telephone Encounter (Signed)
Requested Prescriptions   Signed Prescriptions Disp Refills  . clopidogrel (PLAVIX) 75 MG tablet 30 tablet 6    Sig: TAKE ONE (1) TABLET EACH DAY    Authorizing Provider: Kathlyn Sacramento A    Ordering User: Britt Bottom

## 2015-08-13 ENCOUNTER — Other Ambulatory Visit: Payer: Self-pay | Admitting: *Deleted

## 2015-08-13 MED ORDER — METOPROLOL TARTRATE 25 MG PO TABS: ORAL_TABLET | ORAL | Status: DC

## 2015-08-13 NOTE — Telephone Encounter (Signed)
Requested Prescriptions   Signed Prescriptions Disp Refills  . metoprolol tartrate (LOPRESSOR) 25 MG tablet 60 tablet 6    Sig: TAKE ONE TABLET EVERY 12 HOURS    Authorizing Provider: Kathlyn Sacramento A    Ordering User: Britt Bottom

## 2015-11-25 ENCOUNTER — Ambulatory Visit (INDEPENDENT_AMBULATORY_CARE_PROVIDER_SITE_OTHER): Payer: Medicaid Other | Admitting: Cardiovascular Disease

## 2015-11-25 ENCOUNTER — Encounter: Payer: Self-pay | Admitting: Cardiovascular Disease

## 2015-11-25 VITALS — BP 140/90 | HR 78 | Ht 70.5 in | Wt 218.8 lb

## 2015-11-25 DIAGNOSIS — G4730 Sleep apnea, unspecified: Secondary | ICD-10-CM

## 2015-11-25 DIAGNOSIS — I2510 Atherosclerotic heart disease of native coronary artery without angina pectoris: Secondary | ICD-10-CM | POA: Diagnosis not present

## 2015-11-25 DIAGNOSIS — I10 Essential (primary) hypertension: Secondary | ICD-10-CM | POA: Diagnosis not present

## 2015-11-25 MED ORDER — AMLODIPINE BESYLATE 5 MG PO TABS: 5.0000 mg | ORAL_TABLET | Freq: Every day | ORAL | Status: DC

## 2015-11-25 MED ORDER — ROSUVASTATIN CALCIUM 20 MG PO TABS
20.0000 mg | ORAL_TABLET | Freq: Every day | ORAL | Status: DC
Start: 2015-11-25 — End: 2016-09-29

## 2015-11-25 MED ORDER — LISINOPRIL 40 MG PO TABS: ORAL_TABLET | ORAL | Status: DC

## 2015-11-25 NOTE — Patient Instructions (Signed)
Medication Instructions:  Your physician has recommended you make the following change in your medication:  STOP taking plavix STOP taking atorvastatin START taking crestor 20mg  once daily START taking amlodipine 5mg  once daily    Labwork: Fasting lipid and liver profile in 6 weeks. Nothing to eat or drink after midnight the evening before your labs.  Testing/Procedures: none  Follow-Up: Your physician wants you to follow-up in: six months with Dr. Fletcher Anon.  You will receive a reminder letter in the mail two months in advance. If you don't receive a letter, please call our office to schedule the follow-up appointment.   Any Other Special Instructions Will Be Listed Below (If Applicable). Referral to pulmonary for sleep apnea     If you need a refill on your cardiac medications before your next appointment, please call your pharmacy.

## 2015-11-25 NOTE — Progress Notes (Signed)
Cardiology Office Note   Date:  11/25/2015   ID:  Alan, Robinson 1956/04/10, MRN [id number]  PCP:  Perrin Maltese, MD  Cardiologist:   Kathlyn Sacramento, MD   Chief Complaint  Patient presents with  . Follow-up    no cp, sob with exertion and no swelling. no other complaints.      History of Present Illness: [name] is a 60 y.o. male who presents for a followup visit regarding coronary artery disease. He presented in 12/2012 to [facility] with acute inferior ST elevation myocardial infarction. He underwent cardiac catheterization which showed an occluded proximal RCA with mild disease in the left circumflex and LAD. He underwent successful thrombectomy and drug-eluting stent placement without complications. Ejection fraction was normal.  He quit smoking since that event. He did have elevated liver enzymes on atorvastatin and thus the dose was gradually decreased to 10 mg once daily. Lower extremity cramping resolved. He had exertional chest pain in April of 2015. He underwent a nuclear stress test which showed no evidence of ischemia with normal ejection fraction. He has been doing reasonably well with rare episodes of chest pain. He complains of exertional dyspnea with occasional wheezing. He also complains of being tired and sleepy throughout the day which he thinks it's due to the medications he takes. He reports loud snoring but he is not aware of history of sleep apnea.  Past Medical History  Diagnosis Date  . Stomach ulcer   . Coronary artery disease     a. 12/2012 Acute inferior STEMI/PCI: occluded proximal RCA with mild LAD/left circumflex disease. Successful thrombectomy and drug-eluting stent placement to the proximal RCA. Ejection fraction was normal.  . Hyperlipidemia   . Essential hypertension   . Near syncope     a. ER visit 01/2015 - ? etiology.    Past Surgical History  Procedure Laterality Date  . Hernia repair    . Appendectomy    . Cardiac catheterization   6/14    [facility];EF 65%     Current Outpatient Prescriptions  Medication Sig Dispense Refill  . aspirin 81 MG tablet Take 81 mg by mouth daily.    [name] lisinopril (PRINIVIL,ZESTRIL) 40 MG tablet TAKE ONE (1) TABLET EACH DAY 30 tablet 6  . metoprolol tartrate (LOPRESSOR) 25 MG tablet TAKE ONE TABLET EVERY 12 HOURS 60 tablet 6  . nitroGLYCERIN (NITROSTAT) 0.4 MG SL tablet Place 0.4 mg under the tongue every 5 (five) minutes as needed for chest pain.    [name] amLODipine (NORVASC) 5 MG tablet Take 1 tablet (5 mg total) by mouth daily. 30 tablet 6  . rosuvastatin (CRESTOR) 20 MG tablet Take 1 tablet (20 mg total) by mouth daily. 30 tablet 6   No current facility-administered medications for this visit.    Allergies:   Review of patient's allergies indicates no known allergies.    Social History:  The patient  reports that he quit smoking about 2 years ago. His smoking use included Cigarettes. He has a 30 pack-year smoking history. He does not have any smokeless tobacco history on file. He reports that he does not use illicit drugs.   Family History:  The patient's family history includes Heart disease in his father; Heart failure in his father.    ROS:  Please see the history of present illness.   Otherwise, review of systems are positive for none.   All other systems are reviewed and negative.    PHYSICAL EXAM: VS:  BP 140/90 mmHg  Pulse 78  Ht 5' 10.5" (1.791 m)  Wt 218 lb 12.8 oz (99.247 kg)  BMI 30.94 kg/m2 , BMI Body mass index is 30.94 kg/(m^2). GEN: Well nourished, well developed, in no acute distress HEENT: normal Neck: no JVD, carotid bruits, or masses Cardiac: RRR; no murmurs, rubs, or gallops,no edema  Respiratory:  clear to auscultation bilaterally, normal work of breathing GI: soft, nontender, nondistended, + BS MS: no deformity or atrophy Skin: warm and dry, no rash Neuro:  Strength and sensation are intact Psych: euthymic mood, full affect   EKG:  EKG is not ordered  today.    Recent Labs: 02/02/2015: ALT 32; BUN 12; Creatinine, Ser 1.08; Hemoglobin 15.5; Platelets 221; Potassium 3.6; Sodium 141    Lipid Panel    Component Value Date/Time   CHOL 162 11/25/2014 1135   CHOL 212* 12/19/2012 0601   TRIG 90 11/25/2014 1135   TRIG 142 12/19/2012 0601   HDL 43 11/25/2014 1135   HDL 31* 12/19/2012 0601   CHOLHDL 3.8 11/25/2014 1135   VLDL 28 12/19/2012 0601   LDLCALC 101* 11/25/2014 1135   LDLCALC 153* 12/19/2012 0601      Wt Readings from Last 3 Encounters:  11/25/15 218 lb 12.8 oz (99.247 kg)  05/27/15 218 lb (98.884 kg)  02/02/15 216 lb (97.977 kg)       ASSESSMENT AND PLAN:  1.  Coronary artery disease involving native coronary arteries without angina:He is overall doing reasonably well with no anginal symptoms. I elected to discontinue Plavix. Continue aspirin indefinitely.  2. Essential hypertension: Blood pressure continues to be elevated. I added amlodipine 5 mg once daily.  3. Hyperlipidemia: Most recent LDL was 101 which is not at target. He had elevated liver enzymes on higher dose atorvastatin. Thus, I elected to switch him to rosuvastatin 20 mg once daily. Repeat fasting lipid and liver profile in 6 weeks.  4. Daytime somnolence: History highly suggestive of sleep apnea. Thus, I referred him to pulmonary for evaluation. He also reports frequent wheezing and dyspnea. He is a previous smoker and might have COPD. He would probably require pulmonary function testing.    Disposition:   FU with me in 6 months  Signed,  Kathlyn Sacramento, MD  11/25/2015 5:11 PM    [place]

## 2015-12-03 ENCOUNTER — Emergency Department: Payer: Medicaid Other

## 2015-12-03 ENCOUNTER — Encounter: Payer: Self-pay | Admitting: Radiology

## 2015-12-03 ENCOUNTER — Emergency Department
Admission: EM | Admit: 2015-12-03 | Discharge: 2015-12-03 | Disposition: A | Discharge status: Home / Self Care | Payer: Medicaid Other | Attending: Emergency Medicine | Admitting: Emergency Medicine

## 2015-12-03 DIAGNOSIS — I10 Essential (primary) hypertension: Secondary | ICD-10-CM | POA: Insufficient documentation

## 2015-12-03 DIAGNOSIS — E785 Hyperlipidemia, unspecified: Secondary | ICD-10-CM | POA: Diagnosis not present

## 2015-12-03 DIAGNOSIS — Z87891 Personal history of nicotine dependence: Secondary | ICD-10-CM | POA: Insufficient documentation

## 2015-12-03 DIAGNOSIS — R1084 Generalized abdominal pain: Secondary | ICD-10-CM

## 2015-12-03 DIAGNOSIS — Z7982 Long term (current) use of aspirin: Secondary | ICD-10-CM | POA: Diagnosis not present

## 2015-12-03 DIAGNOSIS — R112 Nausea with vomiting, unspecified: Secondary | ICD-10-CM | POA: Diagnosis not present

## 2015-12-03 DIAGNOSIS — Z79899 Other long term (current) drug therapy: Secondary | ICD-10-CM | POA: Insufficient documentation

## 2015-12-03 DIAGNOSIS — J189 Pneumonia, unspecified organism: Secondary | ICD-10-CM | POA: Insufficient documentation

## 2015-12-03 DIAGNOSIS — I2510 Atherosclerotic heart disease of native coronary artery without angina pectoris: Secondary | ICD-10-CM | POA: Insufficient documentation

## 2015-12-03 LAB — COMPREHENSIVE METABOLIC PANEL
ALBUMIN: 3.7 g/dL (ref 3.5–5.0)
ALK PHOS: 63 U/L (ref 38–126)
ALT: 35 U/L (ref 17–63)
ANION GAP: 7 (ref 5–15)
AST: 39 U/L (ref 15–41)
BUN: 13 mg/dL (ref 6–20)
CALCIUM: 9.3 mg/dL (ref 8.9–10.3)
CO2: 24 mmol/L (ref 22–32)
Chloride: 105 mmol/L (ref 101–111)
Creatinine, Ser: 1.16 mg/dL (ref 0.61–1.24)
GFR calc Af Amer: >60 mL/min (ref >60)
GFR calc non Af Amer: >60 mL/min (ref >60)
GLUCOSE: 150 mg/dL — AB (ref 65–99)
Potassium: 4.1 mmol/L (ref 3.5–5.1)
SODIUM: 136 mmol/L (ref 135–145)
Total Bilirubin: 0.7 mg/dL (ref 0.3–1.2)
Total Protein: 8.5 g/dL — ABNORMAL HIGH (ref 6.5–8.1)

## 2015-12-03 LAB — CBC WITH DIFFERENTIAL/PLATELET
Basophils Absolute: 0.1 10*3/uL (ref 0–0.1)
EOS ABS: 0.1 10*3/uL (ref 0–0.7)
Eosinophils Relative: 1 %
HCT: 44.5 % (ref 40.0–52.0)
HEMOGLOBIN: 15.1 g/dL (ref 13.0–18.0)
Lymphocytes Relative: 10 %
Lymphs Abs: 1.1 10*3/uL (ref 1.0–3.6)
MCH: 29.5 pg (ref 26.0–34.0)
MCHC: 33.8 g/dL (ref 32.0–36.0)
MCV: 87.1 fL (ref 80.0–100.0)
Monocytes Absolute: 0.7 10*3/uL (ref 0.2–1.0)
Monocytes Relative: 6 %
Neutro Abs: 9.1 10*3/uL — ABNORMAL HIGH (ref 1.4–6.5)
Platelets: 347 10*3/uL (ref 150–440)
RBC: 5.11 MIL/uL (ref 4.40–5.90)
RDW: 14.1 % (ref 11.5–14.5)
WBC: 11.2 10*3/uL — AB (ref 3.8–10.6)

## 2015-12-03 LAB — LIPASE, BLOOD: Lipase: 24 U/L (ref 11–51)

## 2015-12-03 MED ORDER — LEVOFLOXACIN 750 MG PO TABS
750.0000 mg | ORAL_TABLET | Freq: Once | ORAL | Status: AC
  Administered 2015-12-03: 750 mg via ORAL
  Filled 2015-12-03: qty 1

## 2015-12-03 MED ORDER — ONDANSETRON HCL 4 MG/2ML IJ SOLN
4.0000 mg | Freq: Once | INTRAMUSCULAR | Status: AC
  Administered 2015-12-03: 4 mg via INTRAVENOUS

## 2015-12-03 MED ORDER — IOPAMIDOL (ISOVUE-300) INJECTION 61%
100.0000 mL | Freq: Once | INTRAVENOUS | Status: AC | PRN
  Administered 2015-12-03: 100 mL via INTRAVENOUS

## 2015-12-03 MED ORDER — HYDROCOD POLST-CPM POLST ER 10-8 MG/5ML PO SUER: 5.0000 mL | Freq: Two times a day (BID) | ORAL | Status: DC

## 2015-12-03 MED ORDER — MORPHINE SULFATE (PF) 4 MG/ML IV SOLN
4.0000 mg | Freq: Once | INTRAVENOUS | Status: AC
  Administered 2015-12-03: 4 mg via INTRAVENOUS

## 2015-12-03 MED ORDER — ONDANSETRON HCL 4 MG PO TABS: 4.0000 mg | ORAL_TABLET | Freq: Every day | ORAL | Status: DC | PRN

## 2015-12-03 MED ORDER — SODIUM CHLORIDE 0.9 % IV SOLN
1000.0000 mL | Freq: Once | INTRAVENOUS | Status: AC
  Administered 2015-12-03: 1000 mL via INTRAVENOUS

## 2015-12-03 MED ORDER — LEVOFLOXACIN 750 MG PO TABS: 750.0000 mg | ORAL_TABLET | Freq: Every day | ORAL | Status: DC

## 2015-12-03 MED ORDER — DIATRIZOATE MEGLUMINE & SODIUM 66-10 % PO SOLN
15.0000 mL | Freq: Once | ORAL | Status: AC
  Administered 2015-12-03: 15 mL via ORAL

## 2015-12-03 NOTE — Discharge Instructions (Signed)
Community-Acquired Pneumonia, Adult °Pneumonia is an infection of the lungs. There are different types of pneumonia. One type can develop while a person is in a hospital. A different type, called community-acquired pneumonia, develops in people who are not, or have not recently been, in the hospital or other health care facility.  °CAUSES °Pneumonia may be caused by bacteria, viruses, or funguses. Community-acquired pneumonia is often caused by Streptococcus pneumonia bacteria. These bacteria are often passed from one person to another by breathing in droplets from the cough or sneeze of an infected person. °RISK FACTORS °The condition is more likely to develop in: °· People who have chronic diseases, such as chronic obstructive pulmonary disease (COPD), asthma, congestive heart failure, cystic fibrosis, diabetes, or kidney disease. °· People who have early-stage or late-stage HIV. °· People who have sickle cell disease. °· People who have had their spleen removed (splenectomy). °· People who have poor dental hygiene. °· People who have medical conditions that increase the risk of breathing in (aspirating) secretions their own mouth and nose.   °· People who have a weakened immune system (immunocompromised). °· People who smoke. °· People who travel to areas where pneumonia-causing germs commonly exist. °· People who are around animal habitats or animals that have pneumonia-causing germs, including birds, bats, rabbits, cats, and farm animals. °SYMPTOMS °Symptoms of this condition include: °· A dry cough. °· A wet (productive) cough. °· Fever. °· Sweating. °· Chest pain, especially when breathing deeply or coughing. °· Rapid breathing or difficulty breathing. °· Shortness of breath. °· Shaking chills. °· Fatigue. °· Muscle aches. °DIAGNOSIS °Your health care provider will take a medical history and perform a physical exam. You may also have other tests, including: °· Imaging studies of your chest, including  X-rays. °· Tests to check your blood oxygen level and other blood gases. °· Other tests on blood, mucus (sputum), fluid around your lungs (pleural fluid), and urine. °If your pneumonia is severe, other tests may be done to identify the specific cause of your illness. °TREATMENT °The type of treatment that you receive depends on many factors, such as the cause of your pneumonia, the medicines you take, and other medical conditions that you have. For most adults, treatment and recovery from pneumonia may occur at home. In some cases, treatment must happen in a hospital. Treatment may include: °· Antibiotic medicines, if the pneumonia was caused by bacteria. °· Antiviral medicines, if the pneumonia was caused by a virus. °· Medicines that are given by mouth or through an IV tube. °· Oxygen. °· Respiratory therapy. °Although rare, treating severe pneumonia may include: °· Mechanical ventilation. This is done if you are not breathing well on your own and you cannot maintain a safe blood oxygen level. °· Thoracentesis. This procedure removes fluid around one lung or both lungs to help you breathe better. °HOME CARE INSTRUCTIONS °· Take over-the-counter and prescription medicines only as told by your health care provider. °¨ Only take cough medicine if you are losing sleep. Understand that cough medicine can prevent your body's natural ability to remove mucus from your lungs. °¨ If you were prescribed an antibiotic medicine, take it as told by your health care provider. Do not stop taking the antibiotic even if you start to feel better. °· Sleep in a semi-upright position at night. Try sleeping in a reclining chair, or place a few pillows under your head. °· Do not use tobacco products, including cigarettes, chewing tobacco, and e-cigarettes. If you need help quitting, ask your health care provider. °· Drink enough water to keep your urine   clear or pale yellow. This will help to thin out mucus secretions in your  lungs. PREVENTION There are ways that you can decrease your risk of developing community-acquired pneumonia. Consider getting a pneumococcal vaccine if:  You are older than 61 years of age.  You are older than 61 years of age and are undergoing cancer treatment, have chronic lung disease, or have other medical conditions that affect your immune system. Ask your health care provider if this applies to you. There are different types and schedules of pneumococcal vaccines. Ask your health care provider which vaccination option is best for you. You may also prevent community-acquired pneumonia if you take these actions:  Get an influenza vaccine every year. Ask your health care provider which type of influenza vaccine is best for you.  Go to the dentist on a regular basis.  Wash your hands often. Use hand sanitizer if soap and water are not available. SEEK MEDICAL CARE IF:  You have a fever.  You are losing sleep because you cannot control your cough with cough medicine. SEEK IMMEDIATE MEDICAL CARE IF:  You have worsening shortness of breath.  You have increased chest pain.  Your sickness becomes worse, especially if you are an older adult or have a weakened immune system.  You cough up blood.   This information is not intended to replace advice given to you by your health care provider. Make sure you discuss any questions you have with your health care provider.   Document Released: 06/21/2005 Document Revised: 03/12/2015 Document Reviewed: 10/16/2014 Elsevier Interactive Patient Education 2016 [employer]  Nausea, Adult Nausea is the feeling that you have an upset stomach or have to vomit. Nausea by itself is not likely a serious concern, but it may be an early sign of more serious medical problems. As nausea gets worse, it can lead to vomiting. If vomiting develops, there is the risk of dehydration.  CAUSES   Viral infections.  Food  poisoning.  Medicines.  Pregnancy.  Motion sickness.  Migraine headaches.  Emotional distress.  Severe pain from any source.  Alcohol intoxication. HOME CARE INSTRUCTIONS  Get plenty of rest.  Ask your caregiver about specific rehydration instructions.  Eat small amounts of food and sip liquids more often.  Take all medicines as told by your caregiver. SEEK MEDICAL CARE IF:  You have not improved after 2 days, or you get worse.  You have a headache. SEEK IMMEDIATE MEDICAL CARE IF:   You have a fever.  You faint.  You keep vomiting or have blood in your vomit.  You are extremely weak or dehydrated.  You have dark or bloody stools.  You have severe chest or abdominal pain. MAKE SURE YOU:  Understand these instructions.  Will watch your condition.  Will get help right away if you are not doing well or get worse.   This information is not intended to replace advice given to you by your health care provider. Make sure you discuss any questions you have with your health care provider.   Document Released: 07/29/2004 Document Revised: 07/12/2014 Document Reviewed: 03/03/2011 Elsevier Interactive Patient Education [employer].

## 2015-12-03 NOTE — ED Notes (Signed)
Pt taken to CT at this time.

## 2015-12-03 NOTE — ED Notes (Signed)
Pt arrived via EMS from home c/o abd pain that started early this morning..this was the second time that the patient had called EMS, he came this time because the pain was worse. Pt reports the pain is in the mid to right abd and is achy in nature. Pt has vomited once. Pt reports that he started taking PCN yesterday and took it on an empty stomach and thinks that this may be related to why he is having pain.

## 2015-12-03 NOTE — ED Provider Notes (Signed)
[facility] Emergency Department Provider Note        Time seen: ----------------------------------------- 6:58 AM on 12/03/2015 -----------------------------------------    I have reviewed the triage vital signs and the nursing notes.   HISTORY  Chief Complaint Abdominal Pain    HPI [name] is a 60 y.o. male who presents to ER for abdominal pain that started early this morning. Patient states the pain is gotten much worse. Initially he called EMS but the pain had gotten a little bit better and so he did not come. He came on the second EMS call. He complains of achy abdominal pain, has vomited once. He recently took penicillin for a "throat infection". Patient has not had any fever or diarrhea.   Past Medical History  Diagnosis Date  . Stomach ulcer   . Coronary artery disease     a. 12/2012 Acute inferior STEMI/PCI: occluded proximal RCA with mild LAD/left circumflex disease. Successful thrombectomy and drug-eluting stent placement to the proximal RCA. Ejection fraction was normal.  . Hyperlipidemia   . Essential hypertension   . Near syncope     a. ER visit 01/2015 - ? etiology.    Patient Active Problem List   Diagnosis Date Noted  . Essential hypertension   . Near syncope   . Cardiac murmur 05/23/2014  . Hyperlipidemia   . Hypertension   . Coronary artery disease 12/03/2012    Past Surgical History  Procedure Laterality Date  . Hernia repair    . Appendectomy    . Cardiac catheterization  6/14    [facility];EF 65%    Allergies Review of patient's allergies indicates no known allergies.  Social History Social History  Substance Use Topics  . Smoking status: Former Smoker -- 1.00 packs/day for 30 years    Types: Cigarettes    Quit date: 12/18/2012  . Smokeless tobacco: Not on file  . Alcohol Use: Not on file    Review of Systems Constitutional: Negative for fever. Eyes: Negative for visual changes. ENT: Negative for sore  throat. Cardiovascular: Negative for chest pain. Respiratory: Negative for shortness of breath. Gastrointestinal: Positive for abdominal pain and vomiting Genitourinary: Negative for dysuria. Musculoskeletal: Negative for back pain. Skin: Negative for rash. Neurological: Negative for headaches, focal weakness or numbness.  10-point ROS otherwise negative.  ____________________________________________   PHYSICAL EXAM:  VITAL SIGNS: ED Triage Vitals  Enc Vitals Group     BP 12/03/15 0656 169/90 mmHg     Pulse Rate 12/03/15 0656 70     Resp 12/03/15 0656 19     Temp 12/03/15 0656 98 F (36.7 C)     Temp Source 12/03/15 0656 Oral     SpO2 12/03/15 0656 94 %     Weight 12/03/15 0656 218 lb (98.884 kg)     Height 12/03/15 0656 5\' 10"  (1.778 m)     Head Cir --      Peak Flow --      Pain Score --      Pain Loc --      Pain Edu? --      Excl. in [place]? --     Constitutional: Alert and oriented. Mild distress Eyes: Conjunctivae are normal. PERRL. Normal extraocular movements. ENT   Head: Normocephalic and atraumatic.   Nose: No congestion/rhinnorhea.   Mouth/Throat: Mucous membranes are moist.   Neck: No stridor. Cardiovascular: Normal rate, regular rhythm. No murmurs, rubs, or gallops. Respiratory: Normal respiratory effort without tachypnea nor retractions. Breath sounds  are clear and equal bilaterally. No wheezes/rales/rhonchi. Gastrointestinal: Distended, nonfocal tenderness, normal bowel sounds. Musculoskeletal: Nontender with normal range of motion in all extremities. No lower extremity tenderness nor edema. Neurologic:  Normal speech and language. No gross focal neurologic deficits are appreciated.  Skin:  Skin is warm, dry and intact. No rash noted. Psychiatric: Mood and affect are normal. Speech and behavior are normal.  ____________________________________________  EKG: Interpreted by me.Sinus rhythm with a rate of 70 bpm, normal PR interval, normal QRS,  normal QT interval. Left axis deviation  ____________________________________________  ED COURSE:  Pertinent labs & imaging results that were available during my care of the patient were reviewed by me and considered in my medical decision making (see chart for details). Patient presents to ER with diffuse nonfocal abdominal pain. I will check basic labs, given IV morphine and Zofran. ____________________________________________    LABS (pertinent positives/negatives)  Labs Reviewed  CBC WITH DIFFERENTIAL/PLATELET - Abnormal; Notable for the following:    WBC 11.2 (*)    Neutro Abs 9.1 (*)    All other components within normal limits  COMPREHENSIVE METABOLIC PANEL - Abnormal; Notable for the following:    Glucose, Bld 150 (*)    Total Protein 8.5 (*)    All other components within normal limits  LIPASE, BLOOD  URINALYSIS COMPLETEWITH MICROSCOPIC ([facility] ONLY)    RADIOLOGY Images were viewed by me  Abdomen 2 view Is unremarkable CT of the abdomen and pelvis with contrast IMPRESSION: Airspace consolidation in the posterior segment left lower lobe consistent with pneumonia. More subtle pneumonia posterior right base.  Focal hiatal hernia.  Extensive sigmoid colonic diverticulosis without diverticulitis. No bowel obstruction. No appreciable bowel wall thickening.  Cholelithiasis. Gallbladder wall does not appear appreciably thickened by CT.  No renal or ureteral calculus. No hydronephrosis.  Multiple foci of atherosclerotic calcification. Foci of coronary artery calcification noted.  Appendix absent. ____________________________________________  FINAL ASSESSMENT AND PLAN  Abdominal pain, vomiting, community-acquired pneumonia  Plan: Patient with labs and imaging as dictated above. Patient surprisingly with pneumonia as found on CT scan. He has reported an upper respiratory type illness area he was started on Levaquin and will be discharged with antiemetics and  Tussionex for cough and pain. The abdominal pain and vomiting is likely secondary viral illness. Advised him to take a stool softener. He is stable for outpatient follow-up with his doctor.   Earleen Newport, MD   Note: This dictation was prepared with Dragon dictation. Any transcriptional errors that result from this process are unintentional   Earleen Newport, MD 12/03/15 1009

## 2015-12-03 NOTE — ED Notes (Signed)
NAD noted at time of D/C. Pt denies questions or concerns. Pt taken to the lobby via wheelchair at this time.  

## 2015-12-03 NOTE — ED Notes (Signed)
Pt repositioned in bed at this time. Pt's mom remains at bedside at this time.

## 2015-12-06 ENCOUNTER — Emergency Department
Admission: EM | Admit: 2015-12-06 | Discharge: 2015-12-06 | Disposition: A | Discharge status: Home / Self Care | Payer: Medicaid Other | Attending: Emergency Medicine | Admitting: Emergency Medicine

## 2015-12-06 ENCOUNTER — Encounter: Payer: Self-pay | Admitting: Emergency Medicine

## 2015-12-06 DIAGNOSIS — K5900 Constipation, unspecified: Secondary | ICD-10-CM | POA: Insufficient documentation

## 2015-12-06 DIAGNOSIS — I10 Essential (primary) hypertension: Secondary | ICD-10-CM | POA: Diagnosis not present

## 2015-12-06 DIAGNOSIS — E785 Hyperlipidemia, unspecified: Secondary | ICD-10-CM | POA: Diagnosis not present

## 2015-12-06 DIAGNOSIS — Z79899 Other long term (current) drug therapy: Secondary | ICD-10-CM | POA: Diagnosis not present

## 2015-12-06 DIAGNOSIS — Z7982 Long term (current) use of aspirin: Secondary | ICD-10-CM | POA: Insufficient documentation

## 2015-12-06 DIAGNOSIS — I2510 Atherosclerotic heart disease of native coronary artery without angina pectoris: Secondary | ICD-10-CM | POA: Insufficient documentation

## 2015-12-06 DIAGNOSIS — Z87891 Personal history of nicotine dependence: Secondary | ICD-10-CM | POA: Diagnosis not present

## 2015-12-06 LAB — TROPONIN I

## 2015-12-06 LAB — CBC
HEMATOCRIT: 44.6 % (ref 40.0–52.0)
HEMOGLOBIN: 15 g/dL (ref 13.0–18.0)
MCH: 28.9 pg (ref 26.0–34.0)
MCHC: 33.6 g/dL (ref 32.0–36.0)
MCV: 86.2 fL (ref 80.0–100.0)
Platelets: 414 10*3/uL (ref 150–440)
RBC: 5.17 MIL/uL (ref 4.40–5.90)
RDW: 14.5 % (ref 11.5–14.5)
WBC: 11.2 10*3/uL — AB (ref 3.8–10.6)

## 2015-12-06 LAB — COMPREHENSIVE METABOLIC PANEL
ALBUMIN: 3.3 g/dL — AB (ref 3.5–5.0)
ALT: 27 U/L (ref 17–63)
ANION GAP: 9 (ref 5–15)
AST: 29 U/L (ref 15–41)
Alkaline Phosphatase: 66 U/L (ref 38–126)
BUN: 17 mg/dL (ref 6–20)
CO2: 25 mmol/L (ref 22–32)
Calcium: 8.9 mg/dL (ref 8.9–10.3)
Chloride: 101 mmol/L (ref 101–111)
Creatinine, Ser: 1.29 mg/dL — ABNORMAL HIGH (ref 0.61–1.24)
GFR, EST NON AFRICAN AMERICAN: 59 mL/min — AB (ref >60)
Glucose, Bld: 101 mg/dL — ABNORMAL HIGH (ref 65–99)
POTASSIUM: 4.1 mmol/L (ref 3.5–5.1)
Sodium: 135 mmol/L (ref 135–145)
TOTAL PROTEIN: 7.9 g/dL (ref 6.5–8.1)
Total Bilirubin: 1 mg/dL (ref 0.3–1.2)

## 2015-12-06 LAB — LIPASE, BLOOD: Lipase: 19 U/L (ref 11–51)

## 2015-12-06 MED ORDER — DOCUSATE SODIUM 100 MG PO CAPS: 100.0000 mg | ORAL_CAPSULE | Freq: Once | ORAL | Status: DC

## 2015-12-06 MED ORDER — MAGNESIUM CITRATE PO SOLN
0.5000 | Freq: Once | ORAL | Status: AC
Start: 2015-12-06 — End: 2015-12-06
  Administered 2015-12-06: 0.5 via ORAL
  Filled 2015-12-06: qty 296

## 2015-12-06 MED ORDER — LACTULOSE 10 GM/15ML PO SOLN
10.0000 g | Freq: Once | ORAL | Status: AC
  Administered 2015-12-06: 10 g via ORAL
  Filled 2015-12-06: qty 30

## 2015-12-06 MED ORDER — DOCUSATE SODIUM 50 MG/5ML PO LIQD
100.0000 mg | Freq: Once | ORAL | Status: AC
  Administered 2015-12-06: 100 mg via ORAL
  Filled 2015-12-06: qty 10

## 2015-12-06 NOTE — ED Notes (Signed)
Patient states that he has not had a bowel movement in 5 days. Was treated recently for abdominal, which has since resolved . Patient denies nausea and vomiting at this time. Patient denies any fevers or chills.

## 2015-12-06 NOTE — Discharge Instructions (Signed)

## 2015-12-06 NOTE — ED Notes (Signed)
Abdominal pain, no BM x 5 days.

## 2015-12-06 NOTE — ED Provider Notes (Signed)
[facility] Emergency Department Provider Note   ____________________________________________  Time seen: Approximately 1050PM  I have reviewed the triage vital signs and the nursing notes.   HISTORY  Chief Complaint Abdominal Pain   HPI [name] is a 60 y.o. male with a history of a gastric ulcer as well as coronary artery disease was presenting to the emergency department today with constipation. He says he hasn't moved his bowels in 6 days. He denies any recent nausea or vomiting since he was last evaluated in the emergency department. He had been evaluated several days ago in the emergency department for nausea vomiting abdominal pain. He was found have a pneumonia. He says that his cough as well as abdominal pain have subsided. He admits to drinking water and has a limited amount of fruit and vegetable intake. He says that he normally moves his bowels about once per day.   Past Medical History  Diagnosis Date  . Stomach ulcer   . Coronary artery disease     a. 12/2012 Acute inferior STEMI/PCI: occluded proximal RCA with mild LAD/left circumflex disease. Successful thrombectomy and drug-eluting stent placement to the proximal RCA. Ejection fraction was normal.  . Hyperlipidemia   . Essential hypertension   . Near syncope     a. ER visit 01/2015 - ? etiology.    Patient Active Problem List   Diagnosis Date Noted  . Essential hypertension   . Near syncope   . Cardiac murmur 05/23/2014  . Hyperlipidemia   . Hypertension   . Coronary artery disease 12/03/2012    Past Surgical History  Procedure Laterality Date  . Hernia repair    . Appendectomy    . Cardiac catheterization  6/14    [facility];EF 65%    Current Outpatient Rx  Name  Route  Sig  Dispense  Refill  . amLODipine (NORVASC) 5 MG tablet   Oral   Take 1 tablet (5 mg total) by mouth daily.   30 tablet   6   . aspirin 81 MG tablet   Oral   Take 81 mg by mouth daily.           . chlorpheniramine-HYDROcodone (TUSSIONEX PENNKINETIC ER) 10-8 MG/5ML SUER   Oral   Take 5 mLs by mouth 2 (two) times daily.   140 mL   0   . levofloxacin (LEVAQUIN) 750 MG tablet   Oral   Take 1 tablet (750 mg total) by mouth daily.   5 tablet   0   . lisinopril (PRINIVIL,ZESTRIL) 40 MG tablet      TAKE ONE (1) TABLET EACH DAY   30 tablet   6   . metoprolol tartrate (LOPRESSOR) 25 MG tablet      TAKE ONE TABLET EVERY 12 HOURS   60 tablet   6   . nitroGLYCERIN (NITROSTAT) 0.4 MG SL tablet   Sublingual   Place 0.4 mg under the tongue every 5 (five) minutes as needed for chest pain.         [name] ondansetron (ZOFRAN) 4 MG tablet   Oral   Take 1 tablet (4 mg total) by mouth daily as needed for nausea or vomiting.   20 tablet   1   . rosuvastatin (CRESTOR) 20 MG tablet   Oral   Take 1 tablet (20 mg total) by mouth daily.   30 tablet   6     Allergies Review of patient's allergies indicates no known allergies.  Family  History  Problem Relation Age of Onset  . Heart failure Father   . Heart disease Father     Social History Social History  Substance Use Topics  . Smoking status: Former Smoker -- 1.00 packs/day for 30 years    Types: Cigarettes    Quit date: 12/18/2012  . Smokeless tobacco: None  . Alcohol Use: No    Review of Systems Constitutional: No fever/chills Eyes: No visual changes. ENT: No sore throat. Cardiovascular: Denies chest pain. Respiratory: Denies shortness of breath. Gastrointestinal: No abdominal pain.  No nausea, no vomiting.  No diarrhea.   Genitourinary: Negative for dysuria. Musculoskeletal: Negative for back pain. Skin: Negative for rash. Neurological: Negative for headaches, focal weakness or numbness.  10-point ROS otherwise negative.  ____________________________________________   PHYSICAL EXAM:  VITAL SIGNS: ED Triage Vitals  Enc Vitals Group     BP 12/06/15 0910 117/63 mmHg     Pulse Rate 12/06/15 0910 93      Resp 12/06/15 0910 18     Temp 12/06/15 0910 98.5 F (36.9 C)     Temp Source 12/06/15 0910 Oral     SpO2 12/06/15 0910 96 %     Weight 12/06/15 0910 221 lb (100.245 kg)     Height 12/06/15 0910 5\' 10"  (1.778 m)     Head Cir --      Peak Flow --      Pain Score 12/06/15 0912 1     Pain Loc --      Pain Edu? --      Excl. in [place]? --     Constitutional: Alert and oriented. Well appearing and in no acute distress. Eyes: Conjunctivae are normal. PERRL. EOMI. Head: Atraumatic. Nose: No congestion/rhinnorhea. Mouth/Throat: Mucous membranes are moist.   Neck: No stridor.   Cardiovascular: Normal rate, regular rhythm. Grossly normal heart sounds.   Respiratory: Normal respiratory effort.  No retractions. Lungs CTAB. Gastrointestinal: Soft and nontender. No distention. Rectal exam without fecal impaction. Musculoskeletal: No lower extremity tenderness nor edema.  No joint effusions. Neurologic:  Normal speech and language. No gross focal neurologic deficits are appreciated. No gait instability. Skin:  Skin is warm, dry and intact. No rash noted. Psychiatric: Mood and affect are normal. Speech and behavior are normal.  ____________________________________________   LABS (all labs ordered are listed, but only abnormal results are displayed)  Labs Reviewed  COMPREHENSIVE METABOLIC PANEL - Abnormal; Notable for the following:    Glucose, Bld 101 (*)    Creatinine, Ser 1.29 (*)    Albumin 3.3 (*)    GFR calc non Af Amer 59 (*)    All other components within normal limits  CBC - Abnormal; Notable for the following:    WBC 11.2 (*)    All other components within normal limits  LIPASE, BLOOD  TROPONIN I   ____________________________________________  EKG  ED ECG REPORT I, Doran Stabler, the attending physician, personally viewed and interpreted this ECG.   Date: 12/06/2015  EKG Time: 916  Rate: 95  Rhythm: normal sinus rhythm  Axis: Normal  Intervals:left anterior  fascicular block  ST&T Change: No ST segment elevation or depression. No abnormal T-wave inversion. ____________________________________________  RADIOLOGY   ____________________________________________   PROCEDURES   ____________________________________________   INITIAL IMPRESSION / ASSESSMENT AND PLAN / ED COURSE  Pertinent labs & imaging results that were available during my care of the patient were reviewed by me and considered in my medical decision making (see chart for  details).  ----------------------------------------- 12:21 PM on 12/06/2015 -----------------------------------------  Patient had a large bowel movement after his enema and feels completely relieved at this time. Will be discharged to home. Encouraged to drink plenty of water as well as to increase the amount of fiber in his diet. He knows that he may use fiber bars of fiber cereals to do this. He also knows that he should increase the amount of fruits and vegetables that he is eating and CTs daily. White blood cell count is stable from last visit. ____________________________________________   FINAL CLINICAL IMPRESSION(S) / ED DIAGNOSES  Constipation.    NEW MEDICATIONS STARTED DURING THIS VISIT:  New Prescriptions   No medications on file     Note:  This document was prepared using Dragon voice recognition software and may include unintentional dictation errors.    Orbie Pyo, MD 12/06/15 [phone]

## 2016-01-08 ENCOUNTER — Ambulatory Visit (INDEPENDENT_AMBULATORY_CARE_PROVIDER_SITE_OTHER): Payer: Medicaid Other | Admitting: Internal Medicine

## 2016-01-08 ENCOUNTER — Encounter: Payer: Self-pay | Admitting: Internal Medicine

## 2016-01-08 ENCOUNTER — Other Ambulatory Visit: Payer: Medicaid Other

## 2016-01-08 ENCOUNTER — Encounter (INDEPENDENT_AMBULATORY_CARE_PROVIDER_SITE_OTHER): Payer: Self-pay

## 2016-01-08 VITALS — BP 138/82 | HR 87 | Ht 70.5 in | Wt 216.6 lb

## 2016-01-08 DIAGNOSIS — G4719 Other hypersomnia: Secondary | ICD-10-CM | POA: Diagnosis not present

## 2016-01-08 DIAGNOSIS — R0602 Shortness of breath: Secondary | ICD-10-CM

## 2016-01-08 MED ORDER — METOPROLOL SUCCINATE ER 25 MG PO TB24: 25.0000 mg | ORAL_TABLET | Freq: Every day | ORAL | Status: DC

## 2016-01-08 NOTE — Progress Notes (Signed)
[facility] Consultation      Assessment and Plan:  Excessive daytime sleepiness. -Symptoms and signs of obstructive sleep apnea-we'll send for sleep study. -Notes some sleepiness after taking new dose of metoprolol, will change his metoprolol twice a day, to metoprolol XL once nightly. -Discussed his poor sleep hygiene and TV watching atnight, which he should stop.  Dyspnea. -Patient has mild dyspnea on exertion, I suspect that this may be due to some degree to deconditioning, he is encouraged to increase his physical activity. -This chest x-ray does show some hyperinflation, which may be indicative of COPD, and he has a history of smoking, will send for a PFT.  Coronary artery disease. -Known history of coronary artery disease, MI, continue to follow up with Dr. Fletcher Anon.    Date: 01/08/2016  MRN# [id number] [name] 09-21-1955  Referring Physician:   [name] is a 60 y.o. old male seen in consultation for chief complaint of:    Chief Complaint  Patient presents with  . pulmonary consult    pt ref by Dr. Fletcher Anon. pt states after taking his meds he gets extreme fatigue. c/o occ daytime sleepiness, loud snoring, occ restless sleep. EPWORTH: 12    HPI:   Pt is a 60 year old male, he has a history inferior STEMI, for which he sees his cardiologist, Dr. Fletcher Anon. He was noted recently to be sleepy throughout the day, and snores loudly at night. It was also noted that the patient reports episodes of wheezing and dyspnea and has a history of smoking in the past. He notes that he has sleepiness particularly after taking his medications around noon. He wakes at 10 am, he feels sleepy at that time and somewhat tired. He takes his am meds around lunch time including metoprolol and then feels sleepy. He usually takes a nap in the afternoon right after lunch for around 1 hour. He is not working and has disability pending.  He takes his evening pills around 11:30 pm. The  only medicine he takes at night is metoprolol and makes him sleepy, he then goes to bed around 1-2 am. He watches television during that time in bed, then will fall asleep with the tv on, then will wake up later and turn it off.   He notes that he gets dyspnea, he was recommended to walk a mile per day, he tries to do this but runs out of breath. He does no other physical activity other than house chores. He has no trouble breathing with routine ADL's. He can walk a supermarket without difficulty.   He used to smoke 1.5 ppd and quit in 2014 after a heart attack.   CXR images from 02/02/15 reviewed, mild hyperinflation, Otherwise unremarkable.  PMHX:   Past Medical History  Diagnosis Date  . Stomach ulcer   . Coronary artery disease     a. 12/2012 Acute inferior STEMI/PCI: occluded proximal RCA with mild LAD/left circumflex disease. Successful thrombectomy and drug-eluting stent placement to the proximal RCA. Ejection fraction was normal.  . Hyperlipidemia   . Essential hypertension   . Near syncope     a. ER visit 01/2015 - ? etiology.   Surgical Hx:  Past Surgical History  Procedure Laterality Date  . Hernia repair    . Appendectomy    . Cardiac catheterization  6/14    [facility];EF 65%   Family Hx:  Family History  Problem Relation Age of Onset  . Heart failure Father   . Heart  disease Father    Social Hx:   Social History  Substance Use Topics  . Smoking status: Former Smoker -- 1.00 packs/day for 30 years    Types: Cigarettes    Quit date: 12/18/2012  . Smokeless tobacco: Not on file  . Alcohol Use: No   Medication:   Current Outpatient Rx  Name  Route  Sig  Dispense  Refill  . amLODipine (NORVASC) 5 MG tablet   Oral   Take 1 tablet (5 mg total) by mouth daily.   30 tablet   6   . aspirin 81 MG tablet   Oral   Take 81 mg by mouth daily.         . chlorpheniramine-HYDROcodone (TUSSIONEX PENNKINETIC ER) 10-8 MG/5ML SUER   Oral   Take 5 mLs by mouth 2 (two) times  daily.   140 mL   0   . levofloxacin (LEVAQUIN) 750 MG tablet   Oral   Take 1 tablet (750 mg total) by mouth daily.   5 tablet   0   . lisinopril (PRINIVIL,ZESTRIL) 40 MG tablet      TAKE ONE (1) TABLET EACH DAY   30 tablet   6   . metoprolol tartrate (LOPRESSOR) 25 MG tablet      TAKE ONE TABLET EVERY 12 HOURS   60 tablet   6   . nitroGLYCERIN (NITROSTAT) 0.4 MG SL tablet   Sublingual   Place 0.4 mg under the tongue every 5 (five) minutes as needed for chest pain.         [name] ondansetron (ZOFRAN) 4 MG tablet   Oral   Take 1 tablet (4 mg total) by mouth daily as needed for nausea or vomiting.   20 tablet   1   . rosuvastatin (CRESTOR) 20 MG tablet   Oral   Take 1 tablet (20 mg total) by mouth daily.   30 tablet   6       Allergies:  Review of patient's allergies indicates no known allergies.  Review of Systems: Gen:  Denies  fever, sweats, chills HEENT: Denies blurred vision, double vision.  Cvc:  No dizziness, chest pain. Resp:   Denies cough or sputum production Gi: Denies swallowing difficulty, stomach pain. Gu:  Denies bladder incontinence, burning urine Ext:   No Joint pain, stiffness. Skin: No skin rash,  hives  Endoc:  No polyuria, polydipsia. Psych: No depression, insomnia. Other:  All other systems were reviewed with the patient and were negative other that what is mentioned in the HPI.   Physical Examination:   VS: BP 138/82 mmHg  Pulse 87  Ht 5' 10.5" (1.791 m)  Wt 216 lb 9.6 oz (98.249 kg)  BMI 30.63 kg/m2  SpO2 94%  General Appearance: No distress  Neuro:without focal findings,  speech normal,  HEENT: PERRLA, EOM intact.   Pulmonary: normal breath sounds, decreased air entry bilaterally. CardiovascularNormal S1,S2.  No m/r/g.   Abdomen: Benign, Soft, non-tender. Midline epigastric hernia, felt when patient coughs or strains, goes back and easily. Renal:  No costovertebral tenderness  GU:  No performed at this time. Endoc: No  evident thyromegaly, no signs of acromegaly. Skin:   warm, no rashes, no ecchymosis  Extremities: normal, no cyanosis, clubbing.  Other findings:    LABORATORY PANEL:   CBC No results for input(s): WBC, HGB, HCT, PLT in the last 168 hours. ------------------------------------------------------------------------------------------------------------------  Chemistries  No results for input(s): NA, K, CL, CO2, GLUCOSE, BUN, CREATININE, CALCIUM, MG,  AST, ALT, ALKPHOS, BILITOT in the last 168 hours.  Invalid input(s): GFRCGP ------------------------------------------------------------------------------------------------------------------  Cardiac Enzymes No results for input(s): TROPONINI in the last 168 hours. ------------------------------------------------------------  RADIOLOGY:  No results found.     Thank  you for the consultation and for allowing [facility] to assist in the care of your patient. Our recommendations are noted above.  Please contact us if we can be of further service.   Marda Stalker, MD.  Board Certified in Internal Medicine, Pulmonary Medicine, [place], and Sleep Medicine.  [facility] Office Number: [phone]  Patricia Pesa, M.D.  Vilinda Boehringer, M.D.  Merton Border, M.D  01/08/2016

## 2016-01-08 NOTE — Patient Instructions (Addendum)
--  Will send for sleep study.  --Pulmonary function test before next visit.  --Change metoprolol 25 mg twice daily to metoprolol XL 25 every night. Do not take the noon metoprolol, take the new prescription at night.   --Walk every day for half mile.    Sleep Apnea Sleep apnea is disorder that affects a person's sleep. A person with sleep apnea has abnormal pauses in their breathing when they sleep. It is hard for them to get a good sleep. This makes a person tired during the day. It also can lead to other physical problems. There are three types of sleep apnea. One type is when breathing stops for a short time because your airway is blocked (obstructive sleep apnea). Another type is when the brain sometimes fails to give the normal signal to breathe to the muscles that control your breathing (central sleep apnea). The third type is a combination of the other two types. HOME CARE   Take all medicine as told by your doctor.  Avoid alcohol, calming medicines (sedatives), and depressant drugs.  Try to lose weight if you are overweight. Talk to your doctor about a healthy weight goal.  Your doctor may have you use a device that helps to open your airway. It can help you get the air that you need. It is called a positive airway pressure (PAP) device.   MAKE SURE YOU:   Understand these instructions.  Will watch your condition.  Will get help right away if you are not doing well or get worse.  It may take approximately 1 month for you to get used to wearing her CPAP every night.

## 2016-01-20 ENCOUNTER — Telehealth: Payer: Self-pay | Admitting: Cardiovascular Disease

## 2016-01-20 NOTE — Telephone Encounter (Signed)
S/w pt regarding lipid and liver profile that needs to be drawn as ordered at 5/23 OV with Dr. Fletcher Anon. Pt agreeable to 7/24 @ 8:00am and understands these are fasting labs.

## 2016-01-26 ENCOUNTER — Other Ambulatory Visit: Payer: Medicaid Other

## 2016-01-26 DIAGNOSIS — I2510 Atherosclerotic heart disease of native coronary artery without angina pectoris: Secondary | ICD-10-CM

## 2016-01-27 LAB — HEPATIC FUNCTION PANEL
ALBUMIN: 4.4 g/dL (ref 3.6–4.8)
ALK PHOS: 63 IU/L (ref 39–117)
ALT: 36 IU/L (ref 0–44)
AST: 30 IU/L (ref 0–40)
BILIRUBIN TOTAL: 1.2 mg/dL (ref 0.0–1.2)
Bilirubin, Direct: 0.29 mg/dL (ref 0.00–0.40)
TOTAL PROTEIN: 7.5 g/dL (ref 6.0–8.5)

## 2016-01-27 LAB — LIPID PANEL
CHOLESTEROL TOTAL: 107 mg/dL (ref 100–199)
Chol/HDL Ratio: 2.7 ratio units (ref 0.0–5.0)
HDL: 40 mg/dL (ref >39)
LDL Calculated: 46 mg/dL (ref 0–99)
Triglycerides: 105 mg/dL (ref 0–149)
VLDL CHOLESTEROL CAL: 21 mg/dL (ref 5–40)

## 2016-02-12 ENCOUNTER — Ambulatory Visit: Payer: Medicaid Other | Attending: Pulmonary Disease

## 2016-02-12 DIAGNOSIS — R0602 Shortness of breath: Secondary | ICD-10-CM | POA: Diagnosis not present

## 2016-02-12 DIAGNOSIS — G4719 Other hypersomnia: Secondary | ICD-10-CM | POA: Diagnosis present

## 2016-02-12 DIAGNOSIS — G4761 Periodic limb movement disorder: Secondary | ICD-10-CM | POA: Diagnosis not present

## 2016-02-12 DIAGNOSIS — G4733 Obstructive sleep apnea (adult) (pediatric): Secondary | ICD-10-CM | POA: Insufficient documentation

## 2016-02-12 DIAGNOSIS — I2510 Atherosclerotic heart disease of native coronary artery without angina pectoris: Secondary | ICD-10-CM | POA: Insufficient documentation

## 2016-02-17 DIAGNOSIS — G4730 Sleep apnea, unspecified: Secondary | ICD-10-CM | POA: Diagnosis not present

## 2016-02-23 ENCOUNTER — Other Ambulatory Visit: Payer: Self-pay

## 2016-02-23 ENCOUNTER — Telehealth: Payer: Self-pay | Admitting: Cardiovascular Disease

## 2016-02-23 NOTE — Telephone Encounter (Signed)
Pt states his Metoprolol -once every 12 hours is working better than one every 24 hours. Pt would like his rx changed to reflect. Please call.

## 2016-02-23 NOTE — Telephone Encounter (Signed)
S/w pt who reports new metoprolol dosage is not keeping HR controlled. At July OV, Dr. Ashby Dawes changed metoprolol tartrate 25mg   to succinate to be taken at bedtime. He took the nightly dose for one week but did not feel this helped his daytime sleepiness. He noticed HR elevated during the day so he switched back to tartrate 25mg  BID. Will forward to Dr. Ashby Dawes to make aware. Pt had no further questions at this time.

## 2016-03-31 ENCOUNTER — Telehealth: Payer: Self-pay | Admitting: Cardiovascular Disease

## 2016-03-31 NOTE — Telephone Encounter (Signed)
Pt reports he was diagnosed w/PNA in mid-August. 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 States he feels better and just wanted to update Dr. Fletcher Anon.  Pt inquires as to whether he needs to keep PFT and f/u appt w/Dr. Ashby Dawes as he feels better. Pt was referred to pulmonology at 5/23 OV w/Dr. Fletcher Anon. He was ordered a sleep study after 7/6 OV w/Ramachandran. Pt states he does not remember receiving sleep study results and has concerns regarding pulmonary OV and sleep study test. States he feels he was "forced to do the sleep study and I was just an experiment,  like I was made to feel like I had to do this." He then stated "I will just let it ride and forget about that part. I don't feel comfortable about the situation" He stated that he would like to cancel PFT and f/u pulmonary OV. Informed pt I would cancel both and offered to make 6 month f/u appt w/Dr. Fletcher Anon. He is agreeable to Nov 10 @ 11:45am.   Pt is appreciative of the call w/no further questions at this time.

## 2016-03-31 NOTE — Telephone Encounter (Signed)
Pt calling stating he is having some concerns about  Pt had Vasculitis and from that he developed a cough and he was told that could have come from the medication he took for it Would like to know if he still needs to see the lung doctor since it seems to be all good now Please advise.

## 2016-04-08 ENCOUNTER — Encounter: Payer: Medicaid Other | Admitting: Internal Medicine

## 2016-04-14 ENCOUNTER — Ambulatory Visit: Payer: Medicaid Other | Admitting: Internal Medicine

## 2016-04-27 ENCOUNTER — Telehealth: Payer: Self-pay | Admitting: Cardiovascular Disease

## 2016-04-27 NOTE — Telephone Encounter (Signed)
°*  STAT* If patient is at the pharmacy, call can be transferred to refill team.   1. Which medications need to be refilled? (please list name of each medication and dose if known) Metoprolol 25 mg po BID  2. Which pharmacy/location (including street and city if local pharmacy) is medication to be sent to?[facility]  [address]  3. Do they need a 30 day or 90 day supply? [place]

## 2016-04-27 NOTE — Telephone Encounter (Signed)
Pt requesting refill for metoprolol 25 mg BID medication last changed by Dr. Ashby Dawes, please advise ok to refill.

## 2016-04-28 ENCOUNTER — Other Ambulatory Visit: Payer: Self-pay

## 2016-04-28 MED ORDER — METOPROLOL TARTRATE 25 MG PO TABS: ORAL_TABLET | ORAL | 3 refills | Status: DC

## 2016-04-28 NOTE — Telephone Encounter (Signed)
Confirmed w/pt that he is taking metoprolol 25mg  BID. Refill submitted to [place]

## 2016-04-28 NOTE — Telephone Encounter (Signed)
Called pt back. Male who answered the phone states pt is not there. Will call back.

## 2016-05-14 ENCOUNTER — Ambulatory Visit (INDEPENDENT_AMBULATORY_CARE_PROVIDER_SITE_OTHER): Payer: Medicaid Other | Admitting: Cardiovascular Disease

## 2016-05-14 ENCOUNTER — Encounter: Payer: Self-pay | Admitting: Cardiovascular Disease

## 2016-05-14 VITALS — BP 130/80 | HR 78 | Ht 70.5 in | Wt 237.5 lb

## 2016-05-14 DIAGNOSIS — G4733 Obstructive sleep apnea (adult) (pediatric): Secondary | ICD-10-CM | POA: Diagnosis not present

## 2016-05-14 DIAGNOSIS — I10 Essential (primary) hypertension: Secondary | ICD-10-CM

## 2016-05-14 DIAGNOSIS — E7800 Pure hypercholesterolemia, unspecified: Secondary | ICD-10-CM

## 2016-05-14 DIAGNOSIS — I25119 Atherosclerotic heart disease of native coronary artery with unspecified angina pectoris: Secondary | ICD-10-CM | POA: Diagnosis not present

## 2016-05-14 NOTE — Progress Notes (Signed)
Cardiology Office Note   Date:  05/14/2016   ID:  [name], DOB Apr 15, 1956, MRN [id number]  PCP:  Perrin Maltese, MD  Cardiologist:   Kathlyn Sacramento, MD   Chief Complaint  Patient presents with  . other    6 month follow up. Meds reviewed by the pt. verbally. "doing well."      History of Present Illness: [name] is a 60 y.o. male who presents for a followup visit regarding coronary artery disease. He presented in 12/2012 to [facility] with acute inferior ST elevation myocardial infarction. He underwent cardiac catheterization which showed an occluded proximal RCA with mild disease in the left circumflex and LAD. He underwent successful thrombectomy and drug-eluting stent placement without complications. Ejection fraction was normal.  He quit smoking since that event. He did have elevated liver enzymes on atorvastatin . He had exertional chest pain in April of 2015. He underwent a nuclear stress test which showed no evidence of ischemia with normal ejection fraction. During last visit, I discontinued clopidogrel. Amlodipine was added for elevated blood pressure. Atorvastatin was switched to rosuvastatin. Due to fatigue and daytime somnolence, he was referred for a sleep study which confirmed sleep apnea. He did not follow-up to get CPAP. He is overall doing well from a cardiac standpoint with no chest pain or shortness of breath.  The patient developed some form of vasculitis that was treated by rheumatology and nephrology. He was placed on prednisone and has gained more than 20 pounds since then. Prednisone was discontinued recently.   Past Medical History:  Diagnosis Date  . Coronary artery disease    a. 12/2012 Acute inferior STEMI/PCI: occluded proximal RCA with mild LAD/left circumflex disease. Successful thrombectomy and drug-eluting stent placement to the proximal RCA. Ejection fraction was normal.  . Essential hypertension   . Hyperlipidemia   . Near syncope    a.  ER visit 01/2015 - ? etiology.  . Stomach ulcer     Past Surgical History:  Procedure Laterality Date  . APPENDECTOMY    . CARDIAC CATHETERIZATION  6/14   [facility];EF 65%  . HERNIA REPAIR       Current Outpatient Prescriptions  Medication Sig Dispense Refill  . amLODipine (NORVASC) 5 MG tablet Take 1 tablet (5 mg total) by mouth daily. 30 tablet 6  . aspirin 81 MG tablet Take 81 mg by mouth daily.    [name] lisinopril (PRINIVIL,ZESTRIL) 40 MG tablet TAKE ONE (1) TABLET EACH DAY 30 tablet 6  . metoprolol tartrate (LOPRESSOR) 25 MG tablet TAKE ONE TABLET EVERY 12 HOURS 60 tablet 3  . nitroGLYCERIN (NITROSTAT) 0.4 MG SL tablet Place 0.4 mg under the tongue every 5 (five) minutes as needed for chest pain.    . rosuvastatin (CRESTOR) 20 MG tablet Take 1 tablet (20 mg total) by mouth daily. 30 tablet 6   No current facility-administered medications for this visit.     Allergies:   Patient has no known allergies.    Social History:  The patient  reports that he quit smoking about 3 years ago. His smoking use included Cigarettes. He has a 30.00 pack-year smoking history. He has never used smokeless tobacco. He reports that he does not drink alcohol or use drugs.   Family History:  The patient's family history includes Heart disease in his father; Heart failure in his father.    ROS:  Please see the history of present illness.   Otherwise, review of systems are positive for  none.   All other systems are reviewed and negative.    PHYSICAL EXAM: VS:  BP 130/80 (BP Location: Left Arm, Patient Position: Sitting, Cuff Size: Normal)   Pulse 78   Ht 5' 10.5" (1.791 m)   Wt 237 lb 8 oz (107.7 kg)   BMI 33.60 kg/m  , BMI Body mass index is 33.6 kg/m. GEN: Well nourished, well developed, in no acute distress  HEENT: normal  Neck: no JVD, carotid bruits, or masses Cardiac: RRR; no murmurs, rubs, or gallops,no edema  Respiratory:  clear to auscultation bilaterally, normal work of breathing GI:  soft, nontender, nondistended, + BS MS: no deformity or atrophy  Skin: warm and dry, no rash Neuro:  Strength and sensation are intact Psych: euthymic mood, full affect   EKG:  EKG is not ordered today.    Recent Labs: 12/06/2015: BUN 17; Creatinine, Ser 1.29; Hemoglobin 15.0; Platelets 414; Potassium 4.1; Sodium 135 01/26/2016: ALT 36    Lipid Panel    Component Value Date/Time   CHOL 107 01/26/2016 0739   CHOL 212 (H) 12/19/2012 0601   TRIG 105 01/26/2016 0739   TRIG 142 12/19/2012 0601   HDL 40 01/26/2016 0739   HDL 31 (L) 12/19/2012 0601   CHOLHDL 2.7 01/26/2016 0739   VLDL 28 12/19/2012 0601   LDLCALC 46 01/26/2016 0739   LDLCALC 153 (H) 12/19/2012 0601      Wt Readings from Last 3 Encounters:  05/14/16 237 lb 8 oz (107.7 kg)  01/08/16 216 lb 9.6 oz (98.2 kg)  12/06/15 221 lb (100.2 kg)       ASSESSMENT AND PLAN:  1.  Coronary artery disease involving native coronary arteries without angina:He is overall doing reasonably well with no anginal symptoms. Continue aspirin indefinitely.  2. Essential hypertension: Blood pressure Is now well controlled after adding amlodipine.    3. Hyperlipidemia:  Lipid profile improved significantly with rosuvastatin.  4.  Sleep apnea: I advised him to follow-up with pulmonary to get CPAP.   Disposition:   FU with me in 6 months  Signed,  Kathlyn Sacramento, MD  05/14/2016 11:12 AM    [place]

## 2016-05-14 NOTE — Patient Instructions (Signed)
Medication Instructions: Continue same medications.   Labwork: None.   Procedures/Testing: None.   Follow-Up: 1 year with Dr. Cannan Beeck.   Any Additional Special Instructions Will Be Listed Below (If Applicable).     If you need a refill on your cardiac medications before your next appointment, please call your pharmacy.   

## 2016-06-09 ENCOUNTER — Telehealth: Payer: Self-pay | Admitting: Cardiovascular Disease

## 2016-06-09 NOTE — Telephone Encounter (Signed)
Cardiac clearance from [facility] for Feb 2018 colonoscopy placed in MD basket

## 2016-06-17 NOTE — Telephone Encounter (Signed)
Faxed clearance request to [facility], [phone]

## 2016-06-29 ENCOUNTER — Other Ambulatory Visit: Payer: Self-pay | Admitting: *Deleted

## 2016-06-29 MED ORDER — LISINOPRIL 40 MG PO TABS: ORAL_TABLET | ORAL | 3 refills | Status: DC

## 2016-06-29 MED ORDER — AMLODIPINE BESYLATE 5 MG PO TABS: 5.0000 mg | ORAL_TABLET | Freq: Every day | ORAL | 3 refills | Status: DC

## 2016-07-29 ENCOUNTER — Telehealth: Payer: Self-pay | Admitting: Cardiovascular Disease

## 2016-07-29 NOTE — Telephone Encounter (Signed)
Received request for 2/14 colonoscopy from [facility]. Placed in MD basket

## 2016-08-09 NOTE — Telephone Encounter (Signed)
Faxed clearance for Feb 14 colonoscopy to [facility], [phone]

## 2016-08-15 IMAGING — CT CT ABD-PELV W/ CM
2 of 5 series · 15 of 46 positions shown, 17 images · IV contrast (iopamidol)
Comparison: None.

CLINICAL DATA: Right-sided abdominal pain with nausea and vomiting
for 1 day

EXAM:
CT ABDOMEN AND PELVIS WITH CONTRAST
TECHNIQUE: Multidetector CT imaging of the abdomen and pelvis was performed
using the standard protocol following bolus administration of
intravenous contrast. Oral contrast was also administered.
CONTRAST:  100mL JHRA91-4TT IOPAMIDOL (JHRA91-4TT) INJECTION 61%

[Series 2: routine abd pel with · axial · 0.83mm/px · z∈[-658,-213]mm · 12 of 101 slices shown, 14 images]
[im 6/101  soft-tissue]
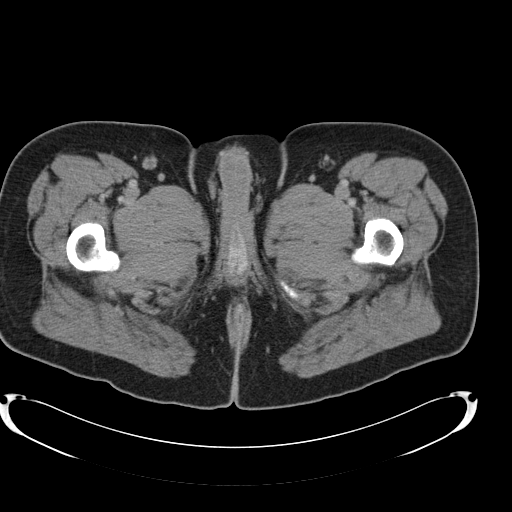
[im 6/101  bone]
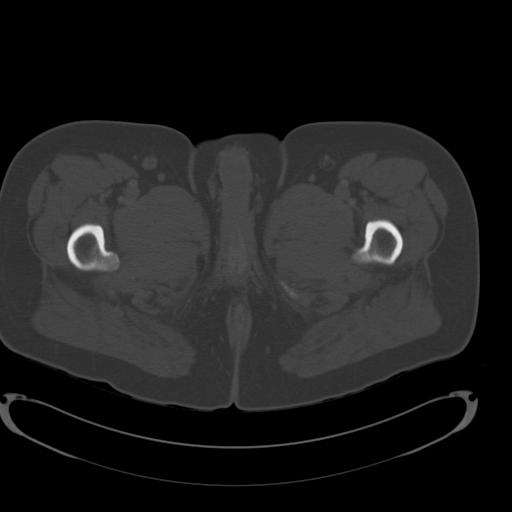
[im 16/101  soft-tissue]
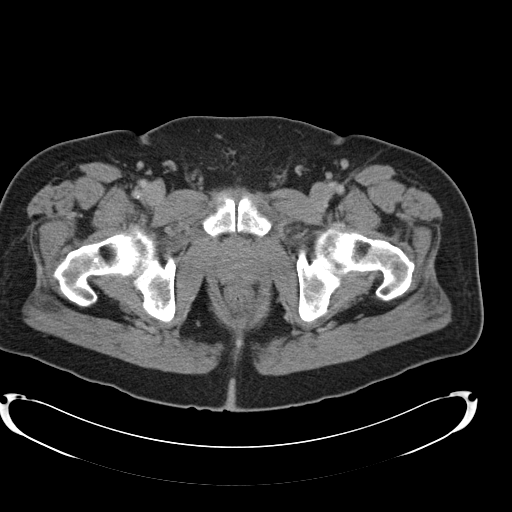
[im 22/101  soft-tissue]
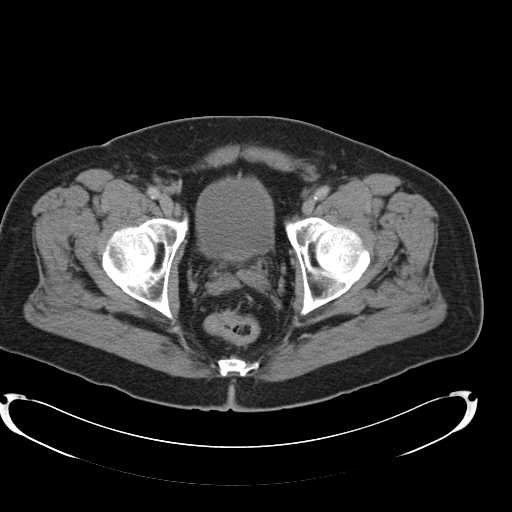
[im 32/101  soft-tissue]
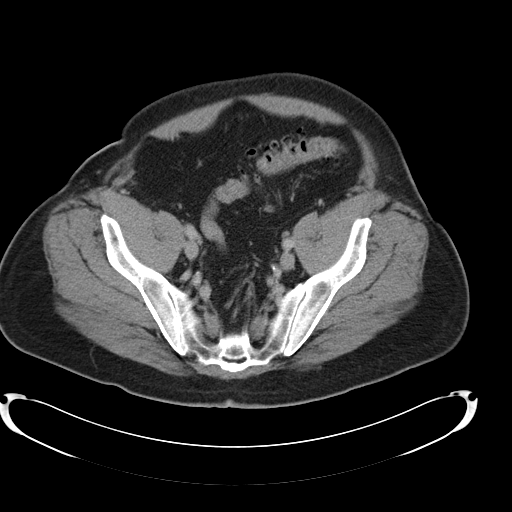
[im 37/101  soft-tissue]
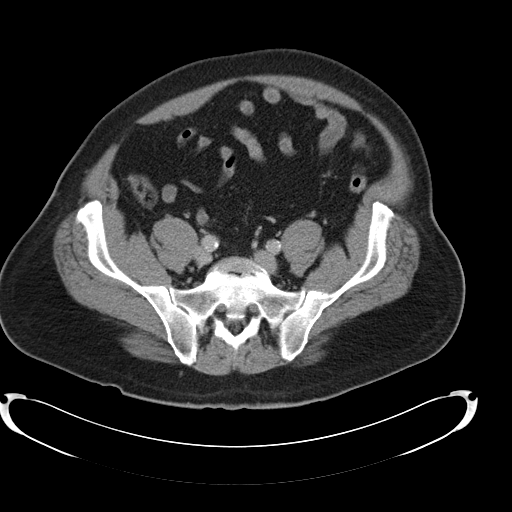
[im 48/101  soft-tissue]
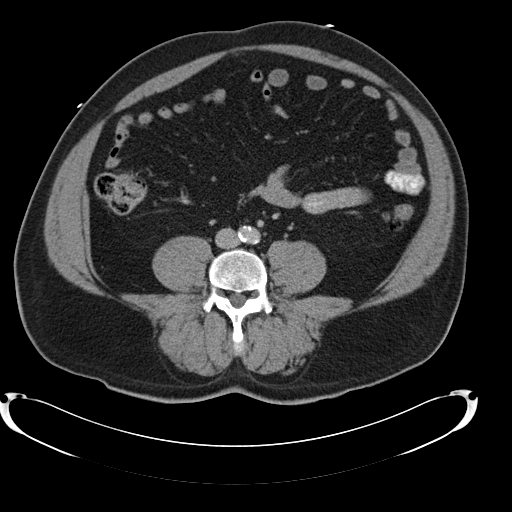
[im 53/101  soft-tissue]
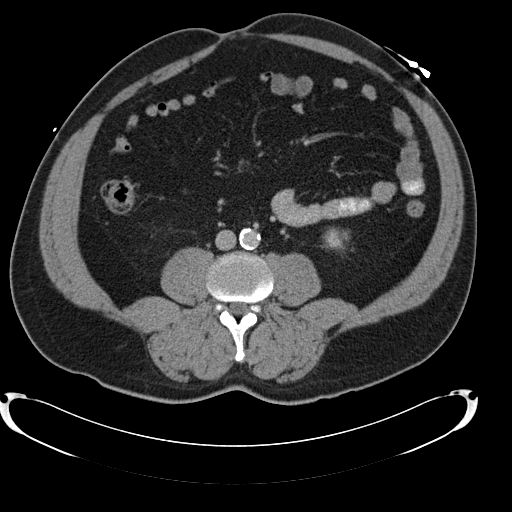
[im 64/101  soft-tissue]
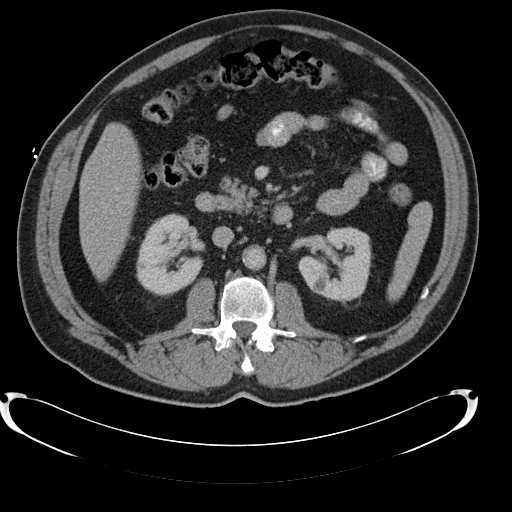
[im 69/101  soft-tissue]
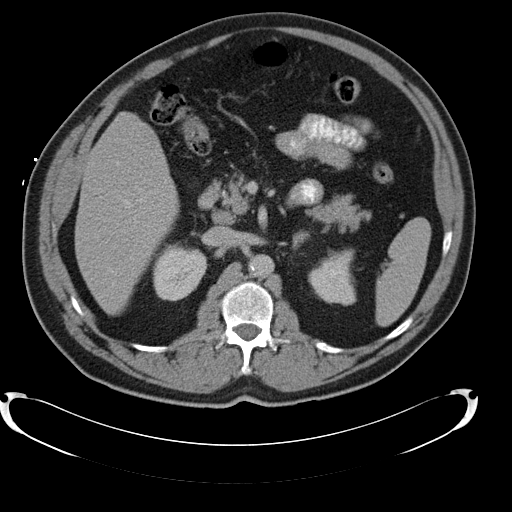
[im 69/101  bone]
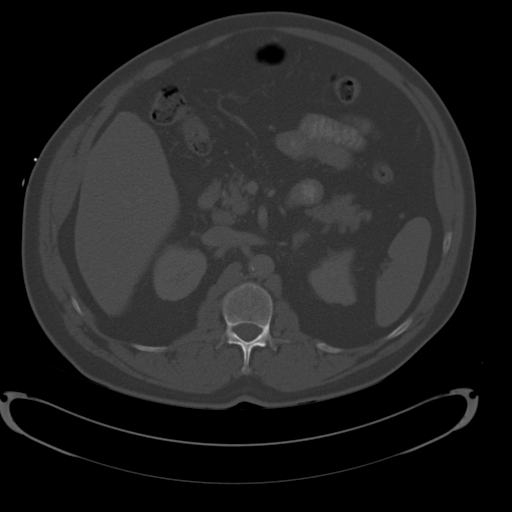
[im 79/101  soft-tissue]
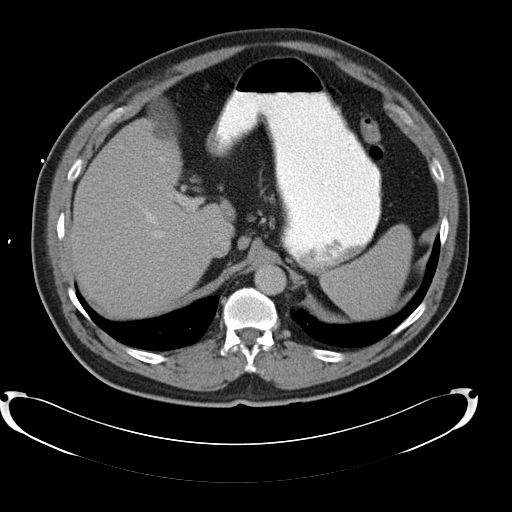
[im 85/101  soft-tissue]
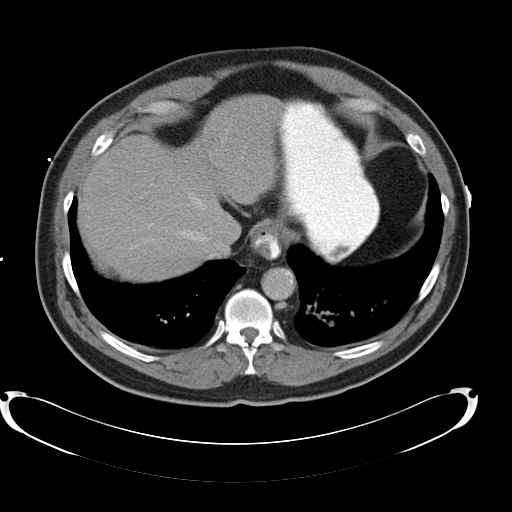
[im 95/101  soft-tissue]
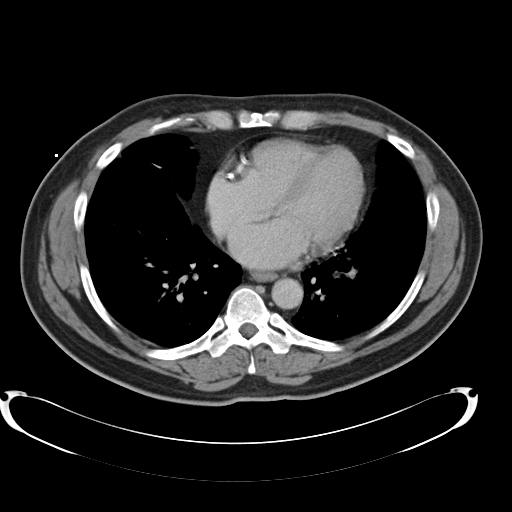

[Series 6: cor routine abd pel with · coronal · 0.77mm/px · 3 of 158 slices shown]
[im 53/158  soft-tissue]
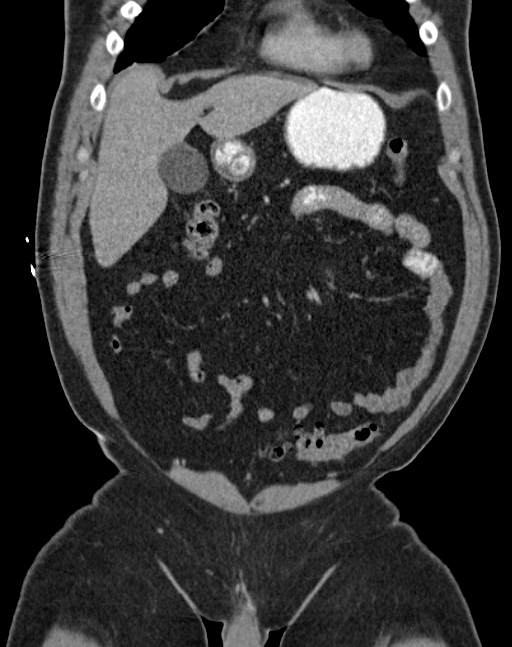
[im 70/158  soft-tissue]
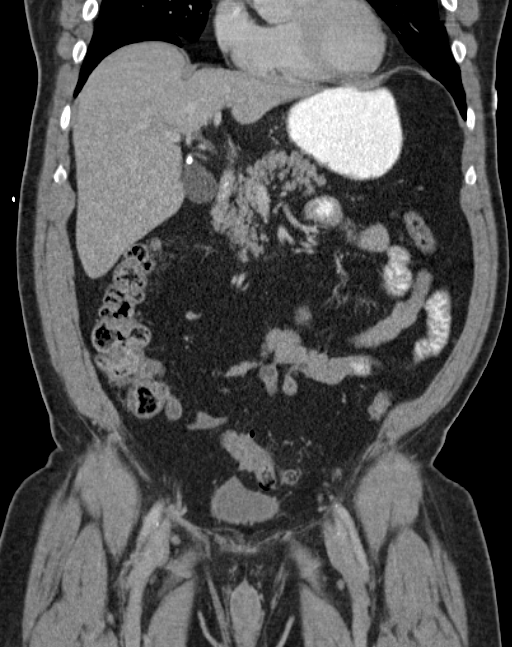
[im 88/158  soft-tissue]
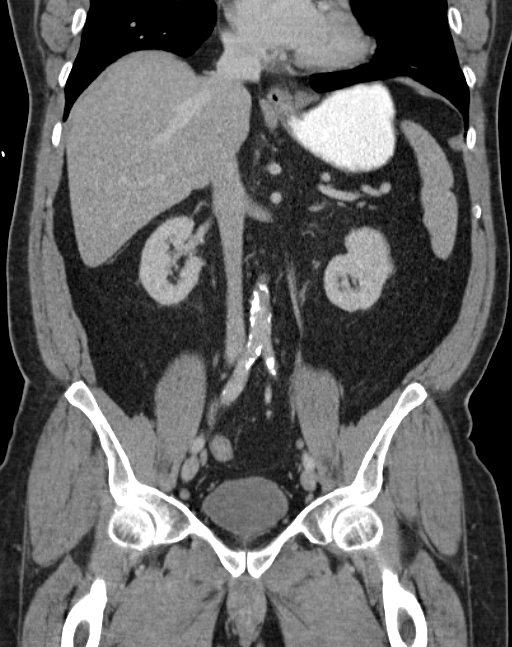

[15 of 46 positions shown; findings below may reference images not displayed]

FINDINGS: Lower chest: There is airspace consolidation in the posterior
segment of the left lower lobe. More subtle tree-in-bud type
appearance is noted in the posterior right base. There is also
patchy bibasilar atelectasis more anteriorly. There is mild lower
lobe bronchiectatic change bilaterally. There are foci of coronary
artery calcification noted. There is a focal hiatal hernia.

Hepatobiliary: Liver measures 18.9 cm in length. No focal liver
lesions are apparent. There is a focal gallstone. Gallbladder wall
is not appreciably thickened. There is no appreciable biliary duct
dilatation.

Pancreas: There is no pancreatic mass or inflammatory focus.

Spleen: No splenic lesions are evident.

Adrenals/Urinary Tract: Adrenals appear unremarkable bilaterally.
There is a cyst in the medial mid left kidney measuring 9 x 9 mm.
There is a probable 5 x 5 mm cyst in the mid right kidney laterally.
There is a cyst in the lower pole left kidney measuring 8 x 7 mm. No
hydronephrosis is evident on either side. There is no renal or
ureteral calculus on either side. Urinary bladder is midline with
wall thickness within normal limits.

Stomach/Bowel: There are multiple sigmoid diverticula without
diverticulitis. There is no appreciable bowel wall or mesenteric
thickening. No bowel obstruction. No free air or portal venous air.

Vascular/Lymphatic: There are foci of atherosclerotic calcification
in the aorta and common iliac arteries. No abdominal aortic aneurysm
is evident. The major mesenteric vessels appear patent. There is no
demonstrable adenopathy in the abdomen or pelvis.

Reproductive: Prostate and seminal vesicles appear unremarkable.
There is no pelvic mass or pelvic fluid collection.

Other: Appendix absent. There is no inflammatory change in the
periappendiceal region. No ascites or abscess evident in the abdomen
or pelvis.

Musculoskeletal: There is degenerative change in the lumbar spine.
There are no blastic or lytic bone lesions. There is no
intramuscular or abdominal wall lesion.
IMPRESSION: Airspace consolidation in the posterior segment left lower lobe
consistent with pneumonia. More subtle pneumonia posterior right
base.

Focal hiatal hernia.

Extensive sigmoid colonic diverticulosis without diverticulitis. No
bowel obstruction. No appreciable bowel wall thickening.

Cholelithiasis. Gallbladder wall does not appear appreciably
thickened by CT.

No renal or ureteral calculus.  No hydronephrosis.

Multiple foci of atherosclerotic calcification. Foci of coronary
artery calcification noted.

Appendix absent.

## 2016-08-18 ENCOUNTER — Encounter: Payer: Self-pay | Admitting: *Deleted

## 2016-08-18 ENCOUNTER — Ambulatory Visit: Payer: Medicaid Other | Admitting: Anesthesiology

## 2016-08-18 ENCOUNTER — Ambulatory Visit
Admission: RE | Admit: 2016-08-18 | Discharge: 2016-08-18 | Disposition: A | Discharge status: Home / Self Care | Payer: Medicaid Other | Source: Ambulatory Visit | Attending: Unknown Physician Specialty | Admitting: Unknown Physician Specialty

## 2016-08-18 ENCOUNTER — Encounter
Admission: RE | Disposition: A | Discharge status: Home / Self Care | Payer: Self-pay | Source: Ambulatory Visit | Attending: Unknown Physician Specialty

## 2016-08-18 DIAGNOSIS — Z8371 Family history of colonic polyps: Secondary | ICD-10-CM | POA: Diagnosis not present

## 2016-08-18 DIAGNOSIS — Z79899 Other long term (current) drug therapy: Secondary | ICD-10-CM | POA: Insufficient documentation

## 2016-08-18 DIAGNOSIS — I2510 Atherosclerotic heart disease of native coronary artery without angina pectoris: Secondary | ICD-10-CM | POA: Insufficient documentation

## 2016-08-18 DIAGNOSIS — Z7982 Long term (current) use of aspirin: Secondary | ICD-10-CM | POA: Insufficient documentation

## 2016-08-18 DIAGNOSIS — K648 Other hemorrhoids: Secondary | ICD-10-CM | POA: Insufficient documentation

## 2016-08-18 DIAGNOSIS — D128 Benign neoplasm of rectum: Secondary | ICD-10-CM | POA: Insufficient documentation

## 2016-08-18 DIAGNOSIS — I10 Essential (primary) hypertension: Secondary | ICD-10-CM | POA: Diagnosis not present

## 2016-08-18 DIAGNOSIS — I252 Old myocardial infarction: Secondary | ICD-10-CM | POA: Diagnosis not present

## 2016-08-18 DIAGNOSIS — D127 Benign neoplasm of rectosigmoid junction: Secondary | ICD-10-CM | POA: Insufficient documentation

## 2016-08-18 DIAGNOSIS — Z955 Presence of coronary angioplasty implant and graft: Secondary | ICD-10-CM | POA: Insufficient documentation

## 2016-08-18 DIAGNOSIS — Z1211 Encounter for screening for malignant neoplasm of colon: Secondary | ICD-10-CM | POA: Insufficient documentation

## 2016-08-18 DIAGNOSIS — Z87891 Personal history of nicotine dependence: Secondary | ICD-10-CM | POA: Insufficient documentation

## 2016-08-18 DIAGNOSIS — E785 Hyperlipidemia, unspecified: Secondary | ICD-10-CM | POA: Diagnosis not present

## 2016-08-18 HISTORY — PX: COLONOSCOPY WITH PROPOFOL: SHX5780

## 2016-08-18 SURGERY — COLONOSCOPY WITH PROPOFOL
Anesthesia: General

## 2016-08-18 MED ORDER — PROPOFOL 500 MG/50ML IV EMUL
INTRAVENOUS | Status: AC
  Filled 2016-08-18: qty 50

## 2016-08-18 MED ORDER — LIDOCAINE HCL (PF) 2 % IJ SOLN
INTRAMUSCULAR | Status: AC
  Filled 2016-08-18: qty 2

## 2016-08-18 MED ORDER — SODIUM CHLORIDE 0.9 % IV SOLN: INTRAVENOUS | Status: DC

## 2016-08-18 MED ORDER — LIDOCAINE HCL (CARDIAC) 20 MG/ML IV SOLN
INTRAVENOUS | Status: DC | PRN
  Administered 2016-08-18: 50 mg via INTRAVENOUS

## 2016-08-18 MED ORDER — MIDAZOLAM HCL 2 MG/2ML IJ SOLN
INTRAMUSCULAR | Status: AC
  Filled 2016-08-18: qty 2

## 2016-08-18 MED ORDER — PROPOFOL 500 MG/50ML IV EMUL
INTRAVENOUS | Status: DC | PRN
  Administered 2016-08-18: 150 ug/kg/min via INTRAVENOUS

## 2016-08-18 MED ORDER — PROPOFOL 10 MG/ML IV BOLUS
INTRAVENOUS | Status: AC
  Filled 2016-08-18: qty 20

## 2016-08-18 MED ORDER — SODIUM CHLORIDE 0.9 % IV SOLN
INTRAVENOUS | Status: DC
  Administered 2016-08-18: 15:00:00 via INTRAVENOUS

## 2016-08-18 MED ORDER — MIDAZOLAM HCL 2 MG/2ML IJ SOLN
INTRAMUSCULAR | Status: DC | PRN
  Administered 2016-08-18: 2 mg via INTRAVENOUS

## 2016-08-18 MED ORDER — PROPOFOL 10 MG/ML IV BOLUS
INTRAVENOUS | Status: DC | PRN
  Administered 2016-08-18: 20 mg via INTRAVENOUS
  Administered 2016-08-18: 30 mg via INTRAVENOUS

## 2016-08-18 NOTE — H&P (Signed)
Primary Care Physician:  Kasandra Knudsen, NP Primary Gastroenterologist:  Dr. Vira Agar  Pre-Procedure History & Physical: HPI:  [name] is a 61 y.o. male is here for an colonoscopy.   Past Medical History:  Diagnosis Date  . Coronary artery disease    a. 12/2012 Acute inferior STEMI/PCI: occluded proximal RCA with mild LAD/left circumflex disease. Successful thrombectomy and drug-eluting stent placement to the proximal RCA. Ejection fraction was normal.  . Essential hypertension   . Hyperlipidemia   . Near syncope    a. ER visit 01/2015 - ? etiology.  . Stomach ulcer     Past Surgical History:  Procedure Laterality Date  . APPENDECTOMY    . CARDIAC CATHETERIZATION  6/14   [facility];EF 65%  . HERNIA REPAIR      Prior to Admission medications   Medication Sig Start Date End Date Taking? Authorizing Provider  amLODipine (NORVASC) 5 MG tablet Take 1 tablet (5 mg total) by mouth daily. 06/29/16  Yes Wellington Hampshire, MD  aspirin 81 MG tablet Take 81 mg by mouth daily.   Yes Historical Provider, MD  lisinopril (PRINIVIL,ZESTRIL) 40 MG tablet TAKE ONE (1) TABLET EACH DAY 06/29/16  Yes Wellington Hampshire, MD  metoprolol tartrate (LOPRESSOR) 25 MG tablet TAKE ONE TABLET EVERY 12 HOURS 04/28/16  Yes Wellington Hampshire, MD  rosuvastatin (CRESTOR) 20 MG tablet Take 1 tablet (20 mg total) by mouth daily. 11/25/15  Yes Wellington Hampshire, MD  nitroGLYCERIN (NITROSTAT) 0.4 MG SL tablet Place 0.4 mg under the tongue every 5 (five) minutes as needed for chest pain.    Historical Provider, MD    Allergies as of 07/30/2016  . (No Known Allergies)    Family History  Problem Relation Age of Onset  . Heart failure Father   . Heart disease Father     Social History   Social History  . Marital status: Divorced    Spouse name: [name]  . Number of children: [name]  . Years of education: [name]   Occupational History  . Not on file.   Social History Main Topics  . Smoking status: Former Smoker   Packs/day: 1.00    Years: 30.00    Types: Cigarettes    Quit date: 12/18/2012  . Smokeless tobacco: Never Used  . Alcohol use No     Comment: history of ETOH last drink 1998  . Drug use: No     Comment: past marijuana  . Sexual activity: Not on file   Other Topics Concern  . Not on file   Social History Narrative  . No narrative on file    Review of Systems: See HPI, otherwise negative ROS  Physical Exam: BP 139/80   Pulse 70   Temp 97.9 F (36.6 C) (Tympanic)   Resp 18   Ht 5' 10.5" (1.791 m)   Wt 107.5 kg (237 lb)   SpO2 98%   BMI 33.53 kg/m  General:   Alert,  pleasant and cooperative in NAD Head:  Normocephalic and atraumatic. Neck:  Supple; no masses or thyromegaly. Lungs:  Clear throughout to auscultation.    Heart:  Regular rate and rhythm. Abdomen:  Soft, nontender and nondistended. Normal bowel sounds, without guarding, and without rebound.   Neurologic:  Alert and  oriented x4;  grossly normal neurologically.  Impression/Plan: [name] is here for an colonoscopy to be performed for Family history of colon polyps  Risks, benefits, limitations, and alternatives regarding  colonoscopy have  been reviewed with the patient.  Questions have been answered.  All parties agreeable.   Gaylyn Cheers, MD  08/18/2016, 3:51 PM

## 2016-08-18 NOTE — Anesthesia Preprocedure Evaluation (Addendum)
Anesthesia Evaluation  Patient identified by MRN, date of birth, ID band Patient awake    Reviewed: Allergy & Precautions, NPO status , Patient's Chart, lab work & pertinent test results, reviewed documented beta blocker date and time   Airway Mallampati: III  TM Distance: <3 FB     Dental no notable dental hx. (+) Upper Dentures   Pulmonary former smoker,    Pulmonary exam normal        Cardiovascular hypertension, Pt. on medications + CAD  Normal cardiovascular exam+ Valvular Problems/Murmurs      Neuro/Psych negative neurological ROS  negative psych ROS   GI/Hepatic Neg liver ROS, PUD,   Endo/Other  negative endocrine ROS  Renal/GU negative Renal ROS  negative genitourinary   Musculoskeletal negative musculoskeletal ROS (+)   Abdominal Normal abdominal exam  (+)   Peds negative pediatric ROS (+)  Hematology negative hematology ROS (+)   Anesthesia Other Findings Past Medical History: No date: Coronary artery disease     Comment: a. 12/2012 Acute inferior STEMI/PCI: occluded               proximal RCA with mild LAD/left circumflex               disease. Successful thrombectomy and               drug-eluting stent placement to the proximal               RCA. Ejection fraction was normal. No date: Essential hypertension No date: Hyperlipidemia No date: Near syncope     Comment: a. ER visit 01/2015 - ? etiology. No date: Stomach ulcer  Reproductive/Obstetrics                            Anesthesia Physical Anesthesia Plan  ASA: III  Anesthesia Plan: General   Post-op Pain Management:    Induction: Intravenous  Airway Management Planned: Nasal Cannula  Additional Equipment:   Intra-op Plan:   Post-operative Plan:   Informed Consent: I have reviewed the patients History and Physical, chart, labs and discussed the procedure including the risks, benefits and alternatives for  the proposed anesthesia with the patient or authorized representative who has indicated his/her understanding and acceptance.   Dental advisory given  Plan Discussed with: CRNA and Surgeon  Anesthesia Plan Comments:         Anesthesia Quick Evaluation

## 2016-08-18 NOTE — Op Note (Signed)
[facility] Gastroenterology Patient Name: [name] Procedure Date: 08/18/2016 3:51 PM MRN: [id number] Account #: [id number] Date of Birth: 03-16-1956 Admit Type: Outpatient Age: 61 Room: [facility] ENDO ROOM 4 Gender: Male Note Status: Finalized Procedure:            Colonoscopy Indications:          Colon cancer screening in patient at increased risk:                        Family history of 1st-degree relative with colon polyps Providers:            Manya Silvas, MD Referring MD:         Ivin Poot. Matthew Saras (Referring MD) Medicines:            Propofol per Anesthesia Complications:        No immediate complications. Procedure:            Pre-Anesthesia Assessment:                       - After reviewing the risks and benefits, the patient                        was deemed in satisfactory condition to undergo the                        procedure.                       After obtaining informed consent, the colonoscope was                        passed under direct vision. Throughout the procedure,                        the patient's blood pressure, pulse, and oxygen                        saturations were monitored continuously. The                        Colonoscope was introduced through the anus and                        advanced to the the cecum, identified by appendiceal                        orifice and ileocecal valve. The colonoscopy was                        performed without difficulty. The patient tolerated the                        procedure well. The quality of the bowel preparation                        was adequate to identify polyps. Findings:      An area in the distal sigmoid showed some edema and erythema suggesting       early diverticulitis like inflammation      A diminutive polyp was found in the recto-sigmoid colon. The polyp was  sessile. The polyp was removed with a jumbo cold forceps. Resection and       retrieval were  complete.      A diminutive polyp was found in the rectum. The polyp was sessile. The       polyp was removed with a jumbo cold forceps. Resection and retrieval       were complete.      Internal hemorrhoids were found during endoscopy. The hemorrhoids were       small, medium-sized and Grade I (internal hemorrhoids that do not       prolapse).      The exam was otherwise without abnormality. Impression:           - One diminutive polyp at the recto-sigmoid colon,                        removed with a jumbo cold forceps. Resected and                        retrieved.                       - One diminutive polyp in the rectum, removed with a                        jumbo cold forceps. Resected and retrieved.                       - Internal hemorrhoids.                       - The examination was otherwise normal. Recommendation:       - Await pathology results. Give antibiotics for sigmoid                        inflammation. Manya Silvas, MD 08/18/2016 4:21:18 PM This report has been signed electronically. Number of Addenda: 0 Note Initiated On: 08/18/2016 3:51 PM Scope Withdrawal Time: 0 hours 12 minutes 18 seconds  Total Procedure Duration: 0 hours 17 minutes 39 seconds       [facility]

## 2016-08-18 NOTE — Anesthesia Post-op Follow-up Note (Cosign Needed)
Anesthesia QCDR form completed.        

## 2016-08-18 NOTE — Anesthesia Postprocedure Evaluation (Signed)
Anesthesia Post Note  Patient: [name]  Procedure(s) Performed: Procedure(s) (LRB): COLONOSCOPY WITH PROPOFOL ([name])  Patient location during evaluation: Endoscopy Anesthesia Type: General Level of consciousness: awake and alert and oriented Pain management: pain level controlled Vital Signs Assessment: post-procedure vital signs reviewed and stable Respiratory status: spontaneous breathing, nonlabored ventilation and respiratory function stable Cardiovascular status: blood pressure returned to baseline and stable Postop Assessment: no signs of nausea or vomiting Anesthetic complications: no     Last Vitals:  Vitals:   08/18/16 1642 08/18/16 1652  BP: 114/73 127/69  Pulse: 68 69  Resp: 19 16  Temp:      Last Pain:  Vitals:   08/18/16 1622  TempSrc: Tympanic                 Gill Delrossi

## 2016-08-18 NOTE — Transfer of Care (Signed)
Immediate Anesthesia Transfer of Care Note  Patient: KELIJAH MEEK  Procedure(s) Performed: Procedure(s): COLONOSCOPY WITH PROPOFOL ([name])  Patient Location: PACU  Anesthesia Type:General  Level of Consciousness: awake and alert   Airway & Oxygen Therapy: Patient Spontanous Breathing and Patient connected to nasal cannula oxygen  Post-op Assessment: Report given to RN and Post -op Vital signs reviewed and stable  Post vital signs: Reviewed and stable  Last Vitals:  Vitals:   08/18/16 1454 08/18/16 1506  BP:  139/80  Pulse:  70  Resp:  18  Temp: 36.6 C     Last Pain:  Vitals:   08/18/16 1454  TempSrc: Tympanic         Complications: No apparent anesthesia complications

## 2016-08-18 NOTE — Anesthesia Procedure Notes (Signed)
Date/Time: 08/18/2016 3:52 PM Performed by: Johnna Acosta Pre-anesthesia Checklist: Patient identified, Emergency Drugs available, Suction available, Patient being monitored and Timeout performed Patient Re-evaluated:Patient Re-evaluated prior to inductionOxygen Delivery Method: Nasal cannula

## 2016-08-19 ENCOUNTER — Encounter: Payer: Self-pay | Admitting: Unknown Physician Specialty

## 2016-08-20 LAB — SURGICAL PATHOLOGY

## 2016-09-29 ENCOUNTER — Other Ambulatory Visit: Payer: Self-pay

## 2016-09-29 MED ORDER — ROSUVASTATIN CALCIUM 20 MG PO TABS: 20.0000 mg | ORAL_TABLET | Freq: Every day | ORAL | 6 refills | Status: DC

## 2016-09-29 NOTE — Telephone Encounter (Signed)
Requested Prescriptions   Signed Prescriptions Disp Refills  . rosuvastatin (CRESTOR) 20 MG tablet 30 tablet 6    Sig: Take 1 tablet (20 mg total) by mouth daily.    Authorizing Provider: Kathlyn Sacramento A    Ordering User: Janan Ridge

## 2016-10-05 ENCOUNTER — Encounter (INDEPENDENT_AMBULATORY_CARE_PROVIDER_SITE_OTHER): Payer: Self-pay

## 2016-10-05 ENCOUNTER — Ambulatory Visit (INDEPENDENT_AMBULATORY_CARE_PROVIDER_SITE_OTHER): Payer: Medicaid Other | Admitting: Vascular Surgery

## 2016-10-05 ENCOUNTER — Encounter (INDEPENDENT_AMBULATORY_CARE_PROVIDER_SITE_OTHER): Payer: Self-pay | Admitting: Vascular Surgery

## 2016-10-05 VITALS — BP 152/88 | HR 72 | Resp 16 | Wt 234.0 lb

## 2016-10-05 DIAGNOSIS — I25119 Atherosclerotic heart disease of native coronary artery with unspecified angina pectoris: Secondary | ICD-10-CM | POA: Diagnosis not present

## 2016-10-05 DIAGNOSIS — E7800 Pure hypercholesterolemia, unspecified: Secondary | ICD-10-CM | POA: Diagnosis not present

## 2016-10-05 DIAGNOSIS — I10 Essential (primary) hypertension: Secondary | ICD-10-CM

## 2016-10-05 DIAGNOSIS — M7989 Other specified soft tissue disorders: Secondary | ICD-10-CM

## 2016-10-05 NOTE — Patient Instructions (Signed)
Lymphedema Lymphedema is swelling that is caused by the abnormal collection of lymph under the skin. Lymph is fluid from the tissues in your body that travels in the lymphatic system. This system is part of the immune system and includes lymph nodes and lymph vessels. The lymph vessels collect and carry the excess fluid, fats, proteins, and wastes from the tissues of the body to the bloodstream. This system also works to clean and remove bacteria and waste products from the body. Lymphedema occurs when the lymphatic system is blocked. When the lymph vessels or lymph nodes are blocked or damaged, lymph does not drain properly, causing an abnormal buildup of lymph. This leads to swelling in the arms or legs. Lymphedema cannot be cured by medicines, but various methods can be used to help reduce the swelling. What are the causes? There are two types of lymphedema. Primary lymphedema is caused by the absence or abnormality of the lymph vessel at birth. Secondary lymphedema is more common. It occurs when the lymph vessel is damaged or blocked. Common causes of lymph vessel blockage include:  Skin infection, such as cellulitis.  Infection by parasites (filariasis).  Injury.  Cancer.  Radiation therapy.  Formation of scar tissue.  Surgery. What are the signs or symptoms? Symptoms of this condition include:  Swelling of the arm or leg.  A heavy or tight feeling in the arm or leg.  Swelling of the feet, toes, or fingers. Shoes or rings may fit more tightly than before.  Redness of the skin over the affected area.  Limited movement of the affected limb.  Sensitivity to touch or discomfort in the affected limb. How is this diagnosed? This condition may be diagnosed with:  A physical exam.  Medical history.  Bioimpedance spectroscopy. In this test, painless electrical currents are used to measure fluid levels in your body.  Imaging tests, such as:  Lymphoscintigraphy. In this test, a  low dose of a radioactive substance is injected to trace the flow of lymph through the lymph vessels.  MRI.  CT scan.  Duplex ultrasound. This test uses sound waves to produce images of the vessels and the blood flow on a screen.  Lymphangiography. In this test, a contrast dye is injected into the lymph vessel to help show blockages. How is this treated? Treatment for this condition may depend on the cause. Treatment may include:  Exercise. Certain exercises can help fluid move out of the affected limb.  Massage. Gentle massage of the affected limb can help move the fluid out of the area.  Compression. Various methods may be used to apply pressure to the affected limb in order to reduce the swelling.  Wearing compression stockings or sleeves on the affected limb.  Bandaging the affected limb.  Using an external pump that is attached to a sleeve that alternates between applying pressure and releasing pressure.  Surgery. This is usually only done for severe cases. For example, surgery may be done if you have trouble moving the limb or if the swelling does not get better with other treatments. If an underlying condition is causing the lymphedema, treatment for that condition is needed. For example, antibiotic medicines may be used to treat an infection. Follow these instructions at home: Activities   Exercise regularly as directed by your health care provider.  Do not sit with your legs crossed.  When possible, keep the affected limb raised (elevated) above the level of your heart.  Avoid carrying things with an arm that is   affected by lymphedema.  Remember that the affected area is more likely to become injured or infected.  Take these steps to help prevent infection:  Keep the affected area clean and dry.  Protect your skin from cuts. For example, you should use gloves while cooking or gardening. Do not walk barefoot. If you shave the affected area, use an electric  razor. General instructions   Take medicines only as directed by your health care provider.  Eat a healthy diet that includes a lot of fruits and vegetables.  Do not wear tight clothes, shoes, or jewelry.  Do not use heating pads over the affected area.  Avoid having blood pressure checked on the affected limb.  Keep all follow-up visits as directed by your health care provider. This is important. Contact a health care provider if:  You continue to have swelling in your limb.  You have a fever.  You have a cut that does not heal.  You have redness or pain in the affected area.  You have new swelling in your limb that comes on suddenly.  You develop purplish spots or sores (lesions) on your limb. Get help right away if:  You have a skin rash.  You have chills or sweats.  You have shortness of breath. This information is not intended to replace advice given to you by your health care provider. Make sure you discuss any questions you have with your health care provider. Document Released: 04/18/2007 Document Revised: 02/26/2016 Document Reviewed: 05/29/2014 Elsevier Interactive Patient Education  2017 [employer]  

## 2016-10-05 NOTE — Assessment & Plan Note (Signed)
No current anginal symptoms 

## 2016-10-05 NOTE — Assessment & Plan Note (Signed)
blood pressure control important in reducing the progression of atherosclerotic disease. On appropriate oral medications.  

## 2016-10-05 NOTE — Progress Notes (Signed)
Patient ID: [name], male   DOB: 02-08-1956, 61 y.o.   MRN: [id number]  Chief Complaint  Patient presents with  . New Evaluation    Edema in right leg    HPI [name] is a 61 y.o. male.  I am asked to see the patient by Margurite Auerbach, NP for evaluation of right leg swelling. There was no clear inciting event or causative factor that started the symptoms. He denies any trauma, surgery, injury, DVT, or other clear inciting event. He has had 2 negative DVT studies. The right leg is more severly affected. The patient elevates the legs for relief. The pain is described as cramping and fullness in the lower leg. The symptoms are generally most severe in the evening, particularly when they have been on their feet for long periods of time. Elevation has been used to try to improve the symptoms with some success. The patient denies ulceration, fever, chills, or signs of infection. The patient has no previous history of deep venous thrombosis or superficial thrombophlebitis to their knowledge.     Past Medical History:  Diagnosis Date  . Coronary artery disease    a. 12/2012 Acute inferior STEMI/PCI: occluded proximal RCA with mild LAD/left circumflex disease. Successful thrombectomy and drug-eluting stent placement to the proximal RCA. Ejection fraction was normal.  . Essential hypertension   . Hyperlipidemia   . Near syncope    a. ER visit 01/2015 - ? etiology.  . Stomach ulcer     Past Surgical History:  Procedure Laterality Date  . APPENDECTOMY    . CARDIAC CATHETERIZATION  6/14   [facility];EF 65%  . COLONOSCOPY WITH PROPOFOL [name] 08/18/2016   Procedure: COLONOSCOPY WITH PROPOFOL;  Surgeon: Manya Silvas, MD;  Location: [facility] ENDOSCOPY;  Service: Endoscopy;  Laterality: [name];  . HERNIA REPAIR      Family History  Problem Relation Age of Onset  . Heart failure Father   . Heart disease Father   No bleeding disorders, clotting disorders, autoimmune diseases, or  aneurysms  Social History Social History  Substance Use Topics  . Smoking status: Former Smoker    Packs/day: 1.00    Years: 30.00    Types: Cigarettes    Quit date: 12/18/2012  . Smokeless tobacco: Never Used  . Alcohol use No     Comment: history of ETOH last drink 1998  No IV drug use  Allergies  Allergen Reactions  . Latex     Current Outpatient Prescriptions  Medication Sig Dispense Refill  . amLODipine (NORVASC) 5 MG tablet Take 1 tablet (5 mg total) by mouth daily. 30 tablet 3  . aspirin 81 MG tablet Take 81 mg by mouth daily.    [name] lisinopril (PRINIVIL,ZESTRIL) 40 MG tablet TAKE ONE (1) TABLET EACH DAY 30 tablet 3  . metoprolol tartrate (LOPRESSOR) 25 MG tablet TAKE ONE TABLET EVERY 12 HOURS 60 tablet 3  . nitroGLYCERIN (NITROSTAT) 0.4 MG SL tablet Place 0.4 mg under the tongue every 5 (five) minutes as needed for chest pain.    . rosuvastatin (CRESTOR) 20 MG tablet Take 1 tablet (20 mg total) by mouth daily. 30 tablet 6   No current facility-administered medications for this visit.       REVIEW OF SYSTEMS (Negative unless checked)  Constitutional: [] Weight loss  [] Fever  [] Chills Cardiac: [] Chest pain   [] Chest pressure   [] Palpitations   [] Shortness of breath when laying flat   [] Shortness of breath at rest   []   Shortness of breath with exertion. Vascular:  [] Pain in legs with walking   [] Pain in legs at rest   [] Pain in legs when laying flat   [] Claudication   [] Pain in feet when walking  [] Pain in feet at rest  [] Pain in feet when laying flat   [] History of DVT   [] Phlebitis   [x] Swelling in legs   [x] Varicose veins   [] Non-healing ulcers Pulmonary:   [] Uses home oxygen   [] Productive cough   [] Hemoptysis   [] Wheeze  [] COPD   [] Asthma Neurologic:  [x] Dizziness  [x] Blackouts   [] Seizures   [] History of stroke   [] History of TIA  [] Aphasia   [] Temporary blindness   [] Dysphagia   [] Weakness or numbness in arms   [] Weakness or numbness in legs Musculoskeletal:   [] Arthritis   [] Joint swelling   [] Joint pain   [] Low back pain Hematologic:  [] Easy bruising  [] Easy bleeding   [] Hypercoagulable state   [] Anemic  [] Hepatitis Gastrointestinal:  [] Blood in stool   [] Vomiting blood  [] Gastroesophageal reflux/heartburn   [] Abdominal pain Genitourinary:  [] Chronic kidney disease   [] Difficult urination  [] Frequent urination  [] Burning with urination   [] Hematuria Skin:  [] Rashes   [] Ulcers   [] Wounds Psychological:  [] History of anxiety   []  History of major depression.    Physical Exam BP (!) 152/88 (BP Location: Right Arm)   Pulse 72   Resp 16   Wt 234 lb (106.1 kg)   BMI 33.10 kg/m  Gen:  WD/WN, NAD Head: [place]/AT, No temporalis wasting.  Ear/Nose/Throat: Hearing grossly intact, trachea midline Eyes: Sclera non-icteric. Conjunctiva clear Neck: Supple, no nuchal rigidity. Trachea midline Pulmonary:  Good air movement, no use of accessory muscles, respirations not labored.  Cardiac: RRR, No JVD Vascular: Varicosities scattered and measuring up to 2-3 mm in the right lower extremity        Varicosities scattered and measuring up to 2 mm in the left lower extremity Vessel Right Left  Radial Palpable Palpable  Ulnar Palpable Palpable  Brachial Palpable Palpable  Carotid Palpable, without bruit Palpable, without bruit  Aorta Not palpable [name]  Femoral Palpable Palpable  Popliteal Palpable Palpable  PT Not Palpable 1+ Palpable  DP 1+ Palpable Palpable   Gastrointestinal: soft, non-tender/non-distended.  Musculoskeletal: M/S 5/5 throughout.   1 - 2 + RLE edema.  Trace LLE edema. Moderate stasis changes present in the right lower extremity with minimal stasis changes present on the left. No ulceration. Neurologic: Sensation grossly intact in extremities.  Symmetrical.  Speech is fluent.  Psychiatric: Judgment intact, Mood & affect appropriate for pt's clinical situation. Dermatologic: No rashes or ulcers noted.  No cellulitis or open wounds. Lymph : No  Cervical, Axillary, or Inguinal lymphadenopathy.   Radiology No results found.  Labs Recent Results (from the past 2160 hour(s))  Surgical pathology     Status: None   Collection Time: 08/18/16  4:10 PM  Result Value Ref Range   SURGICAL PATHOLOGY      Surgical Pathology CASE: [id number] PATIENT: Vivia Budge Surgical Pathology Report     SPECIMEN SUBMITTED: A. Colon polyp, recto sigmoid; cbx B. Rectum polyp; cbx  CLINICAL HISTORY: None provided  PRE-OPERATIVE DIAGNOSIS: Family HX of colon polyps  POST-OPERATIVE DIAGNOSIS: Diverticulosis, colon polyp     DIAGNOSIS: A. COLON POLYP, RECTOSIGMOID; COLD BIOPSY: - TUBULAR ADENOMA. - NEGATIVE FOR HIGH-GRADE DYSPLASIA AND MALIGNANCY.  B.  COLON POLYP, RECTUM; COLD BIOPSY: - TUBULAR ADENOMA. - NEGATIVE FOR HIGH-GRADE DYSPLASIA AND MALIGNANCY.  GROSS DESCRIPTION:  A. Labeled: C BX rectosigmoid colon polyp  Tissue fragment(s): 2  Size: 0.3 and 0.4 cm  Description: tan to brown fragments, one possibly fecal material  Entirely submitted in 1 cassette(s).   B. Labeled: C BX rectum polyp  Tissue fragment(s): 2  Size: 0.3 and 0.4 cm  Description: tan fragments  Entirely submitted in 1 cassette(s).    Final Diagnosis performed by Pershing Proud, MD.  Electronically signed 08/20/2016 11:23:07AM    The electronic signature indicates that the named Attending Pathologist has evaluated the specimen  Technical component performed at [facility], [address] [place], [place] [address] Lab: [phone] Dir: Darrick Penna. Evette Doffing, MD  Professional component performed at [facility], [facility], [place], [place], [place] [address] Lab: [phone] Dir: Dellia Nims. Rubinas, MD      Assessment/Plan:  Coronary artery disease No current anginal symptoms  Essential hypertension blood pressure control important in reducing the progression of atherosclerotic disease. On appropriate oral  medications.   Hyperlipidemia lipid control important in reducing the progression of atherosclerotic disease. Continue statin therapy   Swelling of limb I have had a long discussion with the patient regarding swelling and why it  causes symptoms.  Patient will begin wearing graduated compression stockings class 1 (20-30 mmHg) on a daily basis a prescription was given. The patient will  beginning wearing the stockings first thing in the morning and removing them in the evening. The patient is instructed specifically not to sleep in the stockings.   In addition, behavioral modification will be initiated.  This will include frequent elevation, use of over the counter pain medications and exercise such as walking.  I have reviewed systemic causes for chronic edema such as liver, kidney and cardiac etiologies.  The patient denies problems with these organ systems.    Consideration for a lymph pump will also be made based upon the effectiveness of conservative therapy.  This would help to improve the edema control and prevent sequela such as ulcers and infections   Patient should undergo duplex ultrasound of the venous system to ensure that DVT or reflux is not present.  The patient will follow-up with me after the ultrasound.          Leotis Pain 10/05/2016, 10:08 AM   This note was created with Dragon medical transcription system.  Any errors from dictation are unintentional.

## 2016-10-05 NOTE — Assessment & Plan Note (Signed)
lipid control important in reducing the progression of atherosclerotic disease. Continue statin therapy  

## 2016-10-05 NOTE — Assessment & Plan Note (Signed)

## 2016-10-26 ENCOUNTER — Other Ambulatory Visit: Payer: Self-pay

## 2016-10-26 MED ORDER — METOPROLOL TARTRATE 25 MG PO TABS: ORAL_TABLET | ORAL | 3 refills | Status: DC

## 2016-10-26 MED ORDER — METOPROLOL TARTRATE 25 MG PO TABS: ORAL_TABLET | ORAL | 6 refills | Status: DC

## 2016-11-01 ENCOUNTER — Other Ambulatory Visit: Payer: Self-pay | Admitting: *Deleted

## 2016-11-01 MED ORDER — AMLODIPINE BESYLATE 5 MG PO TABS: 5.0000 mg | ORAL_TABLET | Freq: Every day | ORAL | 3 refills | Status: DC

## 2016-11-02 ENCOUNTER — Other Ambulatory Visit: Payer: Self-pay | Admitting: Cardiovascular Disease

## 2016-11-02 MED ORDER — LISINOPRIL 40 MG PO TABS: ORAL_TABLET | ORAL | 3 refills | Status: DC

## 2016-11-16 ENCOUNTER — Telehealth: Payer: Self-pay | Admitting: Cardiovascular Disease

## 2016-11-16 NOTE — Telephone Encounter (Signed)
Received records request from [place] , forwarded to [facility] for processing.

## 2016-12-02 NOTE — Telephone Encounter (Signed)
[employer] called, states [employer] has not received request. Refaxed to [facility]

## 2017-01-07 ENCOUNTER — Ambulatory Visit (INDEPENDENT_AMBULATORY_CARE_PROVIDER_SITE_OTHER): Payer: Medicaid Other | Admitting: Vascular Surgery

## 2017-01-07 ENCOUNTER — Encounter (INDEPENDENT_AMBULATORY_CARE_PROVIDER_SITE_OTHER): Payer: Medicaid Other

## 2017-03-04 ENCOUNTER — Other Ambulatory Visit: Payer: Self-pay | Admitting: *Deleted

## 2017-03-04 ENCOUNTER — Telehealth: Payer: Self-pay | Admitting: Cardiovascular Disease

## 2017-03-04 MED ORDER — LISINOPRIL 40 MG PO TABS: ORAL_TABLET | ORAL | 3 refills | Status: DC

## 2017-03-04 MED ORDER — LISINOPRIL 40 MG PO TABS: ORAL_TABLET | ORAL | 0 refills | Status: DC

## 2017-03-04 MED ORDER — AMLODIPINE BESYLATE 5 MG PO TABS: 5.0000 mg | ORAL_TABLET | Freq: Every day | ORAL | 0 refills | Status: DC

## 2017-03-04 NOTE — Telephone Encounter (Signed)
Requested Prescriptions   Signed Prescriptions Disp Refills  . amLODipine (NORVASC) 5 MG tablet 90 tablet 0    Sig: Take 1 tablet (5 mg total) by mouth daily.    Authorizing Provider: Kathlyn Sacramento A    Ordering User: Eugenio Hoes, Yiselle Babich C  . lisinopril (PRINIVIL,ZESTRIL) 40 MG tablet 90 tablet 0    Sig: TAKE ONE (1) TABLET EACH DAY    Authorizing Provider: Kathlyn Sacramento A    Ordering User: Britt Bottom

## 2017-03-04 NOTE — Telephone Encounter (Signed)
°*  STAT* If patient is at the pharmacy, call can be transferred to refill team.   1. Which medications need to be refilled? (please list name of each medication and dose if known)   Amlodipine 5 mg po daily   Lisinopril 40 mg po daily   2. Which pharmacy/location (including street and city if local pharmacy) is medication to be sent to?  [place]   3. Do they need a 30 day or 90 day supply? [place]

## 2017-03-04 NOTE — Telephone Encounter (Signed)
Requested Prescriptions   Signed Prescriptions Disp Refills  . amLODipine (NORVASC) 5 MG tablet 90 tablet 0    Sig: Take 1 tablet (5 mg total) by mouth daily.    Authorizing Provider: Kathlyn Sacramento A    Ordering User: Eugenio Hoes, MARINA C  . lisinopril (PRINIVIL,ZESTRIL) 40 MG tablet 90 tablet 0    Sig: TAKE ONE (1) TABLET EACH DAY    Authorizing Provider: Kathlyn Sacramento A    Ordering User: Britt Bottom

## 2017-05-10 ENCOUNTER — Encounter (INDEPENDENT_AMBULATORY_CARE_PROVIDER_SITE_OTHER): Payer: Self-pay

## 2017-06-09 ENCOUNTER — Encounter: Payer: Self-pay | Admitting: Cardiovascular Disease

## 2017-06-09 ENCOUNTER — Ambulatory Visit: Payer: Self-pay | Admitting: Cardiovascular Disease

## 2017-06-09 VITALS — BP 130/70 | HR 87 | Ht 70.5 in | Wt 248.0 lb

## 2017-06-09 DIAGNOSIS — R011 Cardiac murmur, unspecified: Secondary | ICD-10-CM | POA: Diagnosis not present

## 2017-06-09 DIAGNOSIS — I2510 Atherosclerotic heart disease of native coronary artery without angina pectoris: Secondary | ICD-10-CM

## 2017-06-09 DIAGNOSIS — E7800 Pure hypercholesterolemia, unspecified: Secondary | ICD-10-CM

## 2017-06-09 DIAGNOSIS — I10 Essential (primary) hypertension: Secondary | ICD-10-CM

## 2017-06-09 MED ORDER — ROSUVASTATIN CALCIUM 20 MG PO TABS: 20.0000 mg | ORAL_TABLET | Freq: Every day | ORAL | 3 refills | Status: DC

## 2017-06-09 MED ORDER — LISINOPRIL 40 MG PO TABS: ORAL_TABLET | ORAL | 3 refills | Status: DC

## 2017-06-09 MED ORDER — METOPROLOL TARTRATE 25 MG PO TABS: ORAL_TABLET | ORAL | 3 refills | Status: DC

## 2017-06-09 MED ORDER — AMLODIPINE BESYLATE 5 MG PO TABS: 5.0000 mg | ORAL_TABLET | Freq: Every day | ORAL | 3 refills | Status: DC

## 2017-06-09 NOTE — Progress Notes (Signed)
Cardiology Office Note   Date:  06/09/2017   ID:  [name], [name], [name], [name]  PCP:  Alan Berry, NP  Cardiologist:   Kathlyn Sacramento, MD   Chief Complaint  Patient presents with  . other    Pt. c/o chest pain at times. Meds reviewed by the pt. verbally.       History of Present Illness: [name] is a 61 y.o. male who presents for a followup visit regarding coronary artery disease. He presented in 06/2014with acute inferior ST elevation myocardial infarction. Cardiac catheterization showed an occluded proximal RCA with mild disease in the left circumflex and LAD. He underwent successful thrombectomy and drug-eluting stent placement to the RCA without complications. Ejection fraction was normal.  He quit smoking since that event. He did have elevated liver enzymes on atorvastatin . He had exertional chest pain in April of 2015. He underwent a nuclear stress test which showed no evidence of ischemia with normal ejection fraction. He has sleep apnea but could not afford CPAP machine. He has been doing reasonably well with only rare episodes of chest pain that does not happen with physical activities.  No shortness of breath.  He retired early and planning to get married in the near future.  Past Medical History:  Diagnosis Date  . Coronary artery disease    a. 12/2012 Acute inferior STEMI/PCI: occluded proximal RCA with mild LAD/left circumflex disease. Successful thrombectomy and drug-eluting stent placement to the proximal RCA. Ejection fraction was normal.  . Essential hypertension   . Hyperlipidemia   . Near syncope    a. ER visit 01/2015 - ? etiology.  . Stomach ulcer     Past Surgical History:  Procedure Laterality Date  . APPENDECTOMY    . CARDIAC CATHETERIZATION  6/14   [facility];EF 65%  . COLONOSCOPY WITH PROPOFOL [name] 08/18/2016   Procedure: COLONOSCOPY WITH PROPOFOL;  Surgeon: Manya Silvas, MD;  Location: [facility] ENDOSCOPY;  Service:  Endoscopy;  Laterality: [name];  . HERNIA REPAIR       Current Outpatient Medications  Medication Sig Dispense Refill  . amLODipine (NORVASC) 5 MG tablet Take 1 tablet (5 mg total) by mouth daily. 90 tablet 0  . aspirin 81 MG tablet Take 81 mg by mouth daily.    [name] lisinopril (PRINIVIL,ZESTRIL) 40 MG tablet TAKE ONE (1) TABLET EACH DAY 90 tablet 0  . metoprolol tartrate (LOPRESSOR) 25 MG tablet TAKE ONE TABLET EVERY 12 HOURS 60 tablet 6  . nitroGLYCERIN (NITROSTAT) 0.4 MG SL tablet Place 0.4 mg under the tongue every 5 (five) minutes as needed for chest pain.    . rosuvastatin (CRESTOR) 20 MG tablet Take 1 tablet (20 mg total) by mouth daily. 30 tablet 6   No current facility-administered medications for this visit.     Allergies:   Latex    Social History:  The patient  reports that he quit smoking about 4 years ago. His smoking use included cigarettes. He has a 30.00 pack-year smoking history. he has never used smokeless tobacco. He reports that he does not drink alcohol or use drugs.   Family History:  The patient's family history includes Heart disease in his father; Heart failure in his father.    ROS:  Please see the history of present illness.   Otherwise, review of systems are positive for none.   All other systems are reviewed and negative.    PHYSICAL EXAM: VS:  BP 130/70 (BP Location:  Left Arm, Patient Position: Sitting, Cuff Size: Large)   Pulse 87   Ht 5' 10.5" (1.791 m)   Wt 248 lb (112.5 kg)   BMI 35.08 kg/m  , BMI Body mass index is 35.08 kg/m. GEN: Well nourished, well developed, in no acute distress  HEENT: normal  Neck: no JVD, carotid bruits, or masses Cardiac: RRR; no rubs, or gallops, mild edema , 2 out of 6 systolic ejection murmur at the aortic area Respiratory:  clear to auscultation bilaterally, normal work of breathing GI: soft, nontender, nondistended, + BS MS: no deformity or atrophy  Skin: warm and dry, no rash Neuro:  Strength and sensation are  intact Psych: euthymic mood, full affect   EKG:  EKG is ordered today. 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  No significant ST or T wave changes.   Recent Labs: No results found for requested labs within last 8760 hours.    Lipid Panel    Component Value Date/Time   CHOL 107 01/26/2016 0739   CHOL 212 (H) 12/19/2012 0601   TRIG 105 01/26/2016 0739   TRIG 142 12/19/2012 0601   HDL 40 01/26/2016 0739   HDL 31 (L) 12/19/2012 0601   CHOLHDL 2.7 01/26/2016 0739   VLDL 28 12/19/2012 0601   LDLCALC 46 01/26/2016 0739   LDLCALC 153 (H) 12/19/2012 0601      Wt Readings from Last 3 Encounters:  06/09/17 248 lb (112.5 kg)  04/Robinson/18 234 lb (106.1 kg)  08/18/16 237 lb (107.5 kg)       ASSESSMENT AND PLAN:  1.  Coronary artery disease involving native coronary arteries without angina:He is overall doing reasonably well with no anginal symptoms. Continue aspirin indefinitely. No contraindication for sex from a cardiac standpoint.  No contraindication for using a phosphodiesterase inhibitor as long as he does not take nitroglycerin in the same 24-hour span.  2. Essential hypertension: Blood pressure Is now well controlled on current medications.  3. Hyperlipidemia:  Lipid profile improved significantly with rosuvastatin.  Most recent LDL was 46.  4.  Cardiac murmur: I requested an echocardiogram for evaluation.  5.  Leg edema: Likely due to chronic venous insufficiency.  Amlodipine is also contributing.  If this worsens,  consider switching amlodipine to thiazide diuretic.   Disposition:   FU with me in 12  months  Signed,  Kathlyn Sacramento, MD  06/09/2017 1:09 PM    [place]

## 2017-06-09 NOTE — Patient Instructions (Addendum)
Medication Instructions:  Your physician recommends that you continue on your current medications as directed. Please refer to the Current Medication list given to you today.   Labwork: none  Testing/Procedures: Your physician has requested that you have an echocardiogram. Echocardiography is a painless test that uses sound waves to create images of your heart. It provides your doctor with information about the size and shape of your heart and how well your heart's chambers and valves are working. This procedure takes approximately one hour. There are no restrictions for this procedure.    Follow-Up: Your physician wants you to follow-up in: 1 year with Dr. Arida.  You will receive a reminder letter in the mail two months in advance. If you don't receive a letter, please call our office to schedule the follow-up appointment.   Any Other Special Instructions Will Be Listed Below (If Applicable).     If you need a refill on your cardiac medications before your next appointment, please call your pharmacy.  Echocardiogram An echocardiogram, or echocardiography, uses sound waves (ultrasound) to produce an image of your heart. The echocardiogram is simple, painless, obtained within a short period of time, and offers valuable information to your health care provider. The images from an echocardiogram can provide information such as:  Evidence of coronary artery disease (CAD).  Heart size.  Heart muscle function.  Heart valve function.  Aneurysm detection.  Evidence of a past heart attack.  Fluid buildup around the heart.  Heart muscle thickening.  Assess heart valve function.  Tell a health care provider about:  Any allergies you have.  All medicines you are taking, including vitamins, herbs, eye drops, creams, and over-the-counter medicines.  Any problems you or family members have had with anesthetic medicines.  Any blood disorders you have.  Any surgeries you have  had.  Any medical conditions you have.  Whether you are pregnant or may be pregnant. What happens before the procedure? No special preparation is needed. Eat and drink normally. What happens during the procedure?  In order to produce an image of your heart, gel will be applied to your chest and a wand-like tool (transducer) will be moved over your chest. The gel will help transmit the sound waves from the transducer. The sound waves will harmlessly bounce off your heart to allow the heart images to be captured in real-time motion. These images will then be recorded.  You may need an IV to receive a medicine that improves the quality of the pictures. What happens after the procedure? You may return to your normal schedule including diet, activities, and medicines, unless your health care provider tells you otherwise. This information is not intended to replace advice given to you by your health care provider. Make sure you discuss any questions you have with your health care provider. Document Released: 06/18/2000 Document Revised: 02/07/2016 Document Reviewed: 02/26/2013 Elsevier Interactive Patient Education  2017 Elsevier Inc.  

## 2017-06-13 ENCOUNTER — Ambulatory Visit: Payer: Self-pay | Admitting: Cardiovascular Disease

## 2017-07-07 ENCOUNTER — Telehealth: Payer: Self-pay | Admitting: Cardiovascular Disease

## 2017-07-07 NOTE — Telephone Encounter (Signed)
Per 12/6 OV notes, echo ordered for heart murmur but was never scheduled. Left message on pt's home VM to contact the scheduling department. Routed to [employer].

## 2017-07-07 NOTE — Telephone Encounter (Signed)
Lm with Remo Lipps (pt brother) to have patient call back and schedule exam

## 2017-07-21 ENCOUNTER — Ambulatory Visit: Payer: Self-pay | Admitting: Cardiovascular Disease

## 2017-07-27 NOTE — Telephone Encounter (Signed)
Lm with Remo Lipps (pt brother) to have patient call back and schedule exam

## 2017-08-03 ENCOUNTER — Encounter: Payer: Self-pay | Admitting: Cardiovascular Disease

## 2017-08-04 NOTE — Telephone Encounter (Signed)
Sent letter

## 2018-06-30 ENCOUNTER — Other Ambulatory Visit: Payer: Self-pay | Admitting: *Deleted

## 2018-06-30 ENCOUNTER — Other Ambulatory Visit: Payer: Self-pay

## 2018-06-30 MED ORDER — LISINOPRIL 40 MG PO TABS: ORAL_TABLET | ORAL | 0 refills | Status: DC

## 2018-06-30 MED ORDER — AMLODIPINE BESYLATE 5 MG PO TABS: 5.0000 mg | ORAL_TABLET | Freq: Every day | ORAL | 0 refills | Status: DC

## 2018-06-30 NOTE — Telephone Encounter (Signed)
Requested Prescriptions   Signed Prescriptions Disp Refills  . lisinopril (PRINIVIL,ZESTRIL) 40 MG tablet 90 tablet 0    Sig: TAKE ONE (1) TABLET EACH DAY    Authorizing Provider: Kathlyn Sacramento A    Ordering User: Janan Ridge

## 2018-08-02 ENCOUNTER — Telehealth: Payer: Self-pay

## 2018-08-02 MED ORDER — AMLODIPINE BESYLATE 5 MG PO TABS: 5.0000 mg | ORAL_TABLET | Freq: Every day | ORAL | 0 refills | Status: DC

## 2018-08-02 NOTE — Telephone Encounter (Signed)
Patient last OV 06/09/2017 amlodipine was last filled 06/30/2018 needs to schedule an appointment for further refills.

## 2018-08-10 ENCOUNTER — Encounter: Payer: Self-pay | Admitting: Nurse Practitioner

## 2018-08-10 ENCOUNTER — Ambulatory Visit (INDEPENDENT_AMBULATORY_CARE_PROVIDER_SITE_OTHER): Payer: BLUE CROSS/BLUE SHIELD | Admitting: Nurse Practitioner

## 2018-08-10 ENCOUNTER — Telehealth: Payer: Self-pay | Admitting: Cardiovascular Disease

## 2018-08-10 VITALS — BP 140/70 | Ht 70.5 in | Wt 254.8 lb

## 2018-08-10 DIAGNOSIS — I10 Essential (primary) hypertension: Secondary | ICD-10-CM | POA: Diagnosis not present

## 2018-08-10 DIAGNOSIS — E785 Hyperlipidemia, unspecified: Secondary | ICD-10-CM

## 2018-08-10 DIAGNOSIS — I2510 Atherosclerotic heart disease of native coronary artery without angina pectoris: Secondary | ICD-10-CM | POA: Diagnosis not present

## 2018-08-10 DIAGNOSIS — Z6836 Body mass index (BMI) 36.0-36.9, adult: Secondary | ICD-10-CM

## 2018-08-10 MED ORDER — NITROGLYCERIN 0.4 MG SL SUBL: 0.4000 mg | SUBLINGUAL_TABLET | SUBLINGUAL | 3 refills | Status: DC | PRN

## 2018-08-10 MED ORDER — METOPROLOL TARTRATE 25 MG PO TABS: ORAL_TABLET | ORAL | 3 refills | Status: DC

## 2018-08-10 MED ORDER — AMLODIPINE BESYLATE 5 MG PO TABS: 5.0000 mg | ORAL_TABLET | Freq: Every day | ORAL | 3 refills | Status: DC

## 2018-08-10 MED ORDER — ROSUVASTATIN CALCIUM 20 MG PO TABS: 20.0000 mg | ORAL_TABLET | Freq: Every day | ORAL | 3 refills | Status: DC

## 2018-08-10 NOTE — Patient Instructions (Signed)
Medication Instructions:  Your physician recommends that you continue on your current medications as directed. Please refer to the Current Medication list given to you today.  If you need a refill on your cardiac medications before your next appointment, please call your pharmacy.   Lab work: None ordered  If you have labs (blood work) drawn today and your tests are completely normal, you will receive your results only by: [name] MyChart Message (if you have MyChart) OR . A paper copy in the mail If you have any lab test that is abnormal or we need to change your treatment, we will call you to review the results.  Testing/Procedures: None ordered   Follow-Up: At [facility], you and your health needs are our priority.  As part of our continuing mission to provide you with exceptional heart care, we have created designated Provider Care Teams.  These Care Teams include your primary Cardiologist (physician) and Advanced Practice Providers (APPs -  Physician Assistants and Nurse Practitioners) who all work together to provide you with the care you need, when you need it. You will need a follow up appointment in 6-9 months.  You may see Kathlyn Sacramento, MD or Murray Hodgkins, NP

## 2018-08-10 NOTE — Progress Notes (Signed)
Office Visit    Patient Name: [name] Date of Encounter: 08/10/2018  Primary Care Provider:  Danelle Berry, NP Primary Cardiologist:  Kathlyn Sacramento, MD  Chief Complaint    63 year old male with a history of coronary artery disease, hypertension, hyperlipidemia, peptic ulcer disease, and sleep apnea, who presents for follow-up related to CAD.  Past Medical History    Past Medical History:  Diagnosis Date  . Coronary artery disease    a. 12/2012 Acute inferior STEMI/PCI: occluded proximal RCA with mild LAD/left circumflex disease. Successful thrombectomy and drug-eluting stent placement to the proximal RCA. EF nl; b. 10/2013 MV: no ischemia/infarct.  . Essential hypertension   . Hyperlipidemia   . Morbid obesity ([place])   . Near syncope    a. ER visit 01/2015 - ? etiology.  . Nonadherence to medication   . Stomach ulcer   . Vasculitis ([place]) 2017   Treated with prednisone.   Past Surgical History:  Procedure Laterality Date  . APPENDECTOMY    . CARDIAC CATHETERIZATION  6/14   [facility];EF 65%  . COLONOSCOPY WITH PROPOFOL [name] 08/18/2016   Procedure: COLONOSCOPY WITH PROPOFOL;  Surgeon: Manya Silvas, MD;  Location: [facility] ENDOSCOPY;  Service: Endoscopy;  Laterality: [name];  . HERNIA REPAIR      Allergies  Allergies  Allergen Reactions  . Latex Rash    History of Present Illness    63 year old male with the above complex past medical history including coronary artery disease, hypertension, hyperlipidemia, peptic ulcer disease, obesity, and sleep apnea.  He is status post inferior myocardial infarction in 2014 with PCI and drug-eluting stent placement to the RCA at that time.  He subsequently underwent stress testing in April 2015 due to recurrent chest discomfort, which was negative for ischemia or infarct.  He was last seen in clinic in December 2018 and says that since then, he has remained relatively sedentary.  He does not typically experience chest discomfort but  does note some degree of chronic dyspnea on exertion, especially with higher levels of activity.  He reports today that he has been noncompliant with his medications, frequently missing doses of metoprolol and rosuvastatin, while discontinuing aspirin altogether.  He says that he did not think he really needed them because he had been feeling well.  He denies palpitations, PND, orthopnea, dizziness, syncope, edema, or early satiety.  Home Medications    Prior to Admission medications   Medication Sig Start Date End Date Taking? Authorizing Provider  amLODipine (NORVASC) 5 MG tablet Take 1 tablet (5 mg total) by mouth daily. Pt needs to contact office to schedule future appointment and refills, Thank you. [phone] 08/02/18  Yes Wellington Hampshire, MD  lisinopril (PRINIVIL,ZESTRIL) 40 MG tablet TAKE ONE (1) TABLET EACH DAY 06/30/18  Yes Wellington Hampshire, MD  metoprolol tartrate (LOPRESSOR) 25 MG tablet TAKE ONE TABLET EVERY 12 HOURS 06/09/17  Yes Wellington Hampshire, MD  nitroGLYCERIN (NITROSTAT) 0.4 MG SL tablet Place 0.4 mg under the tongue every 5 (five) minutes as needed for chest pain.   Yes [provider]  rosuvastatin (CRESTOR) 20 MG tablet Take 1 tablet (20 mg total) by mouth daily. 06/09/17  Yes Wellington Hampshire, MD    Review of Systems    Some degree of chronic dyspnea on exertion which has been stable.  Reports noncompliance with metoprolol aspirin, and statin therapy.  He denies chest pain, palpitations, dyspnea, pnd, orthopnea, n, v, dizziness, syncope, edema, weight gain, or early  satiety.  All other systems reviewed and are otherwise negative except as noted above.  Physical Exam    VS:  BP 140/70 (BP Location: Left Arm, Patient Position: Sitting, Cuff Size: Normal)   Ht 5' 10.5" (1.791 m)   Wt 254 lb 12 oz (115.6 kg)   BMI 36.04 kg/m  , BMI Body mass index is 36.04 kg/m. GEN: Obese, in no acute distress. HEENT: normal. Neck: Supple, no JVD, carotid bruits, or  masses. Cardiac: RRR, no murmurs, rubs, or gallops. No clubbing, cyanosis, edema.  Radials/PT 2+ and equal bilaterally.  Respiratory:  Respirations regular and unlabored, clear to auscultation bilaterally. GI: Soft, nontender, nondistended, BS + x 4. MS: no deformity or atrophy. Skin: warm and dry, no rash. Neuro:  Strength and sensation are intact. Psych: Normal affect.  Accessory Clinical Findings    ECG personally reviewed by me today -regular sinus rhythm, 90, left axis deviation- no acute changes.  Assessment & Plan    1.  Coronary artery disease: Status post inferior MI in 2014 with thrombectomy and drug-eluting stent placement to the occluded RCA at that time.  He has known, mild residual LAD and circumflex disease.  He had a negative Myoview in 2015.  He has felt well over the past year without any significant chest pain.  He does have some degree of chronic dyspnea on exertion which has been stable.  He reports noncompliance with aspirin, statin, and beta-blocker therapy.  Blood pressure and heart rate are mildly elevated today.  I have stressed the importance of compliance with medications and recommended that he resume aspirin while taking beta-blocker and Crestor as prescribed.  He can verbalize understanding of directions and says he will do a better job taking his medications as prescribed.  2.  Essential hypertension: Blood pressure elevated today in the setting of noncompliance.  Refilling medications including amlodipine, metoprolol.  He says he has enough lisinopril at home and has been taking that.  As above, I stressed the importance of compliance.  3.  Hyperlipidemia: He had lipids drawn with primary care last March: Total cholesterol 150, triglycerides 131, HDL 33, LDL 91.  In the setting of noncompliance with rosuvastatin, I advised that he take it daily as prescribed and I suspect in that setting, he would achieve a goal LDL of less than 70.  He confirmed understanding of  this.  He will have follow-up labs with primary care in March when he is due for his annual physical.  4.  Morbid obesity: Patient is relatively sedentary.  Weight is up 20 pounds over the past 2 years.  I recommended that he increase activity and also monitor his caloric intake more closely.  5.  Disposition: Follow-up in 6 months or sooner if necessary.   Murray Hodgkins, NP 08/10/2018, 3:54 PM

## 2018-08-10 NOTE — Telephone Encounter (Signed)
Patient seen by Ignacia Bayley, NP today.  Gerald Stabs, ok to remove NTG from the patient's medication list or with his history do you want him to have this on hand?  Thanks!

## 2018-08-10 NOTE — Telephone Encounter (Signed)
With hx, he should have sl ntg available to him.

## 2018-08-10 NOTE — Telephone Encounter (Signed)
Patient wants [place] to know we should remove nitro from medication list

## 2018-08-11 NOTE — Telephone Encounter (Signed)
I spoke with the patient. I advised him of Ignacia Bayley, NP's recommendations. He states that he has had NTG SL, but never used it. I advised that with his cardiac history, he should keep this on hand. He states he currently has some that is a year old, but he has never opened it.  I offered to send in an updated RX for him, but he declined due to his current income.   I advised that he keep the NTG on hand that he currently has an hasn't opened. The patient voices understanding and is agreeable.

## 2018-09-29 ENCOUNTER — Other Ambulatory Visit: Payer: Self-pay

## 2018-09-29 ENCOUNTER — Other Ambulatory Visit: Payer: Self-pay | Admitting: *Deleted

## 2018-09-29 MED ORDER — LISINOPRIL 40 MG PO TABS: ORAL_TABLET | ORAL | 2 refills | Status: DC

## 2018-09-29 MED ORDER — LISINOPRIL 40 MG PO TABS: ORAL_TABLET | ORAL | 0 refills | Status: DC

## 2018-09-29 NOTE — Telephone Encounter (Signed)
Requested Prescriptions   Signed Prescriptions Disp Refills  . lisinopril (PRINIVIL,ZESTRIL) 40 MG tablet 90 tablet 0    Sig: TAKE ONE (1) TABLET EACH DAY    Authorizing Provider: Kathlyn Sacramento A    Ordering User: Janan Ridge

## 2019-01-02 ENCOUNTER — Telehealth: Payer: Self-pay | Admitting: Cardiovascular Disease

## 2019-01-02 ENCOUNTER — Other Ambulatory Visit: Payer: Self-pay | Admitting: *Deleted

## 2019-01-02 ENCOUNTER — Telehealth: Payer: Self-pay

## 2019-01-02 ENCOUNTER — Other Ambulatory Visit: Payer: Self-pay | Admitting: Cardiovascular Disease

## 2019-01-02 MED ORDER — LISINOPRIL 40 MG PO TABS: ORAL_TABLET | ORAL | 0 refills | Status: DC

## 2019-01-02 MED ORDER — LISINOPRIL 40 MG PO TABS: ORAL_TABLET | ORAL | 3 refills | Status: DC

## 2019-01-02 NOTE — Telephone Encounter (Signed)
°*  STAT* If patient is at the pharmacy, call can be transferred to refill team.   1. Which medications need to be refilled? (please list name of each medication and dose if known)   Lisinopril 40 mg po q d   2. Which pharmacy/location (including street and city if local pharmacy) is medication to be sent to? [facility] [place]   3. Do they need a 30 day or 90 day supply? [place]

## 2019-01-02 NOTE — Telephone Encounter (Signed)
Virtual Visit Pre-Appointment Phone Call  "(Name), I am calling you today to discuss your upcoming appointment. We are currently trying to limit exposure to the virus that causes COVID-19 by seeing patients at home rather than in the office."  1. "What is the BEST phone number to call the day of the visit?" - include this in appointment notes  2. Do you have or have access to (through a family member/friend) a smartphone with video capability that we can use for your visit?" a. If yes - list this number in appt notes as cell (if different from BEST phone #) and list the appointment type as a VIDEO visit in appointment notes b. If no - list the appointment type as a PHONE visit in appointment notes  3. Confirm consent - "In the setting of the current Covid19 crisis, you are scheduled for a (phone or video) visit with your provider on (date) at (time).  Just as we do with many in-office visits, in order for you to participate in this visit, we must obtain consent.  If you'd like, I can send this to your mychart (if signed up) or email for you to review.  Otherwise, I can obtain your verbal consent now.  All virtual visits are billed to your insurance company just like a normal visit would be.  By agreeing to a virtual visit, we'd like you to understand that the technology does not allow for your provider to perform an examination, and thus may limit your provider's ability to fully assess your condition. If your provider identifies any concerns that need to be evaluated in person, we will make arrangements to do so.  Finally, though the technology is pretty good, we cannot assure that it will always work on either your or our end, and in the setting of a video visit, we may have to convert it to a phone-only visit.  In either situation, we cannot ensure that we have a secure connection.  Are you willing to proceed?" STAFF: Did the patient verbally acknowledge consent to telehealth visit? Document  YES/NO here: YES  4. Advise patient to be prepared - "Two hours prior to your appointment, go ahead and check your blood pressure, pulse, oxygen saturation, and your weight (if you have the equipment to check those) and write them all down. When your visit starts, your provider will ask you for this information. If you have an Apple Watch or Kardia device, please plan to have heart rate information ready on the day of your appointment. Please have a pen and paper handy nearby the day of the visit as well."  5. Give patient instructions for MyChart download to smartphone OR Doximity/Doxy.me as below if video visit (depending on what platform provider is using)  6. Inform patient they will receive a phone call 15 minutes prior to their appointment time (may be from unknown caller ID) so they should be prepared to answer    TELEPHONE CALL NOTE  [name] has been deemed a candidate for a follow-up tele-health visit to limit community exposure during the Covid-19 pandemic. I spoke with the patient via phone to ensure availability of phone/video source, confirm preferred email & phone number, and discuss instructions and expectations.  I reminded [name] to be prepared with any vital sign and/or heart rhythm information that could potentially be obtained via home monitoring, at the time of his visit. I reminded [name] to expect a phone call prior to  his visit.  Clarisse Gouge 01/02/2019 8:17 AM   INSTRUCTIONS FOR DOWNLOADING THE MYCHART APP TO SMARTPHONE  - The patient must first make sure to have activated MyChart and know their login information - If Apple, go to [employer] and type in MyChart in the search bar and download the app. If Android, ask patient to go to [employer] and type in [place] in the search bar and download the app. The app is free but as with any other app downloads, their phone may require them to verify saved payment information or Apple/Android  password.  - The patient will need to then log into the app with their MyChart username and password, and select [facility] as their healthcare provider to link the account. When it is time for your visit, go to the MyChart app, find appointments, and click Begin Video Visit. Be sure to Select Allow for your device to access the Microphone and Camera for your visit. You will then be connected, and your provider will be with you shortly.  **If they have any issues connecting, or need assistance please contact MyChart service desk [phone])**  **If using a computer, in order to ensure the best quality for their visit they will need to use either of the following Internet Browsers: [employer], or Google Chrome**  IF USING DOXIMITY or DOXY.ME - The patient will receive a link just prior to their visit by text.     FULL LENGTH CONSENT FOR TELE-HEALTH VISIT   I hereby voluntarily request, consent and authorize [facility] and its employed or contracted physicians, physician assistants, nurse practitioners or other licensed health care professionals (the Practitioner), to provide me with telemedicine health care services (the Services") as deemed necessary by the treating Practitioner. I acknowledge and consent to receive the Services by the Practitioner via telemedicine. I understand that the telemedicine visit will involve communicating with the Practitioner through live audiovisual communication technology and the disclosure of certain medical information by electronic transmission. I acknowledge that I have been given the opportunity to request an in-person assessment or other available alternative prior to the telemedicine visit and am voluntarily participating in the telemedicine visit.  I understand that I have the right to withhold or withdraw my consent to the use of telemedicine in the course of my care at any time, without affecting my right to future care or treatment,  and that the Practitioner or I may terminate the telemedicine visit at any time. I understand that I have the right to inspect all information obtained and/or recorded in the course of the telemedicine visit and may receive copies of available information for a reasonable fee.  I understand that some of the potential risks of receiving the Services via telemedicine include:   Delay or interruption in medical evaluation due to technological equipment failure or disruption;  Information transmitted may not be sufficient (e.g. poor resolution of images) to allow for appropriate medical decision making by the Practitioner; and/or   In rare instances, security protocols could fail, causing a breach of personal health information.  Furthermore, I acknowledge that it is my responsibility to provide information about my medical history, conditions and care that is complete and accurate to the best of my ability. I acknowledge that Practitioner's advice, recommendations, and/or decision may be based on factors not within their control, such as incomplete or inaccurate data provided by me or distortions of diagnostic images or specimens that may result from electronic transmissions. I  understand that the practice of medicine is not an exact science and that Practitioner makes no warranties or guarantees regarding treatment outcomes. I acknowledge that I will receive a copy of this consent concurrently upon execution via email to the email address I last provided but may also request a printed copy by calling the office of [facility].    I understand that my insurance will be billed for this visit.   I have read or had this consent read to me.  I understand the contents of this consent, which adequately explains the benefits and risks of the Services being provided via telemedicine.   I have been provided ample opportunity to ask questions regarding this consent and the Services and have had my questions  answered to my satisfaction.  I give my informed consent for the services to be provided through the use of telemedicine in my medical care  By participating in this telemedicine visit I agree to the above.

## 2019-01-02 NOTE — Telephone Encounter (Signed)
Requested Prescriptions   Signed Prescriptions Disp Refills  . lisinopril (ZESTRIL) 40 MG tablet 90 tablet 0    Sig: TAKE ONE (1) TABLET EACH DAY    Authorizing Provider: Kathlyn Sacramento A    Ordering User: NEWCOMER MCCLAIN, BRANDY L

## 2019-02-27 ENCOUNTER — Ambulatory Visit: Payer: BLUE CROSS/BLUE SHIELD | Admitting: Cardiovascular Disease

## 2019-08-20 ENCOUNTER — Other Ambulatory Visit: Payer: Self-pay

## 2019-08-20 MED ORDER — METOPROLOL TARTRATE 25 MG PO TABS: ORAL_TABLET | ORAL | 0 refills | Status: DC

## 2019-08-20 MED ORDER — AMLODIPINE BESYLATE 5 MG PO TABS: 5.0000 mg | ORAL_TABLET | Freq: Every day | ORAL | 3 refills | Status: DC

## 2019-08-21 ENCOUNTER — Telehealth: Payer: Self-pay

## 2019-08-21 ENCOUNTER — Telehealth: Payer: Self-pay | Admitting: Nurse Practitioner

## 2019-08-21 NOTE — Telephone Encounter (Signed)

## 2019-08-21 NOTE — Telephone Encounter (Signed)
Patient needs an appointment.  Can you please try to call and schedule.  Thanks ladies!

## 2019-08-23 ENCOUNTER — Other Ambulatory Visit: Payer: Self-pay

## 2019-08-23 ENCOUNTER — Telehealth (INDEPENDENT_AMBULATORY_CARE_PROVIDER_SITE_OTHER): Payer: Self-pay | Admitting: Nurse Practitioner

## 2019-08-23 ENCOUNTER — Encounter: Payer: Self-pay | Admitting: Nurse Practitioner

## 2019-08-23 VITALS — Ht 70.5 in | Wt 270.0 lb

## 2019-08-23 DIAGNOSIS — I10 Essential (primary) hypertension: Secondary | ICD-10-CM

## 2019-08-23 DIAGNOSIS — I2510 Atherosclerotic heart disease of native coronary artery without angina pectoris: Secondary | ICD-10-CM

## 2019-08-23 DIAGNOSIS — E785 Hyperlipidemia, unspecified: Secondary | ICD-10-CM

## 2019-08-23 MED ORDER — ASPIRIN EC 81 MG PO TBEC: 81.0000 mg | DELAYED_RELEASE_TABLET | Freq: Every day | ORAL | 3 refills | Status: DC

## 2019-08-23 MED ORDER — LISINOPRIL 40 MG PO TABS: ORAL_TABLET | ORAL | 0 refills | Status: DC

## 2019-08-23 NOTE — Patient Instructions (Signed)
Medication Instructions:   1. START Aspirin 81 - Take 1 tablet by mouth daily  Refills for Metoprolol, Rosuvastatin, & Amlodipine have been sent to pharmacy.   *If you need a refill on your cardiac medications before your next appointment, please call your pharmacy*  Lab Work:  1. None ordered   If you have labs (blood work) drawn today and your tests are completely normal, you will receive your results only by: [name] MyChart Message (if you have MyChart) OR . A paper copy in the mail If you have any lab test that is abnormal or we need to change your treatment, we will call you to review the results.  Testing/Procedures:  1. None ordered  Follow-Up: At [facility], you and your health needs are our priority.  As part of our continuing mission to provide you with exceptional heart care, we have created designated Provider Care Teams.  These Care Teams include your primary Cardiologist (physician) and Advanced Practice Providers (APPs -  Physician Assistants and Nurse Practitioners) who all work together to provide you with the care you need, when you need it.  Your next appointment:   6 months   The format for your next appointment:   In person  Provider:   Dr. Fletcher Anon

## 2019-08-23 NOTE — Progress Notes (Signed)
Virtual Visit via Telephone Note   This visit type was conducted due to national recommendations for restrictions regarding the COVID-19 Pandemic (e.g. social distancing) in an effort to limit this patient's exposure and mitigate transmission in our community.  Due to his co-morbid illnesses, this patient is at least at moderate risk for complications without adequate follow up.  This format is felt to be most appropriate for this patient at this time.  The patient did not have access to video technology/had technical difficulties with video requiring transitioning to audio format only (telephone).  All issues noted in this document were discussed and addressed.  No physical exam could be performed with this format.  Please refer to the patient's chart for his  consent to telehealth for [facility]. Evaluation Performed:  Follow-up visit  This visit type was conducted due to national recommendations for restrictions regarding the COVID-19 Pandemic (e.g. social distancing).  This format is felt to be most appropriate for this patient at this time.  All issues noted in this document were discussed and addressed.  No physical exam was performed (except for noted visual exam findings with Video Visits).  Please refer to the patient's chart (MyChart message for video visits and phone note for telephone visits) for the patient's consent to telehealth for [facility]. _____________   Date:  08/23/2019   Patient ID:  [name], [name] 1956/06/24, MRN [id number] Patient Location:  Home  Provider location:   Office  Primary Care Provider:  Danelle Berry, NP Primary Cardiologist:  Kathlyn Sacramento, MD  Chief Complaint    64 y/o ? with a history of CAD, hypertension, hyperlipidemia, peptic ulcer disease, and sleep apnea, who presents for follow-up related to CAD.  Past Medical History    Past Medical History:  Diagnosis Date  . Coronary artery disease    a. 12/2012 Acute inferior  STEMI/PCI: occluded proximal RCA with mild LAD/left circumflex disease. Successful thrombectomy and drug-eluting stent placement to the proximal RCA. EF nl; b. 10/2013 MV: no ischemia/infarct.  . Essential hypertension   . Hyperlipidemia   . Morbid obesity ([place])   . Near syncope    a. ER visit 01/2015 - ? etiology.  . Nonadherence to medication   . Stomach ulcer   . Vasculitis ([place]) 2017   Treated with prednisone.   Past Surgical History:  Procedure Laterality Date  . APPENDECTOMY    . CARDIAC CATHETERIZATION  6/14   [facility];EF 65%  . COLONOSCOPY WITH PROPOFOL [name] 08/18/2016   Procedure: COLONOSCOPY WITH PROPOFOL;  Surgeon: Manya Silvas, MD;  Location: [facility] ENDOSCOPY;  Service: Endoscopy;  Laterality: [name];  . HERNIA REPAIR      Allergies  Allergies  Allergen Reactions  . Latex Rash    History of Present Illness    [name] is a 64 y.o. male who presents via audio/video conferencing for a telehealth visit today.  As outlined above, he has a history of CAD, hypertension, hyperlipidemia, peptic ulcer disease, and obesity, and sleep apnea.  He is status post inferior myocardial infarction 2014 with PCI and drug-eluting stent placement to the RCA at that time.  He subsequently underwent stress testing April 2015 due to recurrent chest discomfort, which was negative for ischemia or infarct.  He was last seen in clinic in February 2020, at which time he reported chronic, stable dyspnea on exertion along with medication nonadherence, having come off of aspirin, statin, and beta-blocker therapy.  These were all resumed at that  visit.  Over the past year, he says that he has done well.  About once a week, he goes to a putting green for exercise but otherwise, is sedentary.  He denies any dyspnea or exertional chest discomfort.  He occasionally notes indigestion after eating, which has been a chronic issue.  His weight is up 16 pounds since last year and he says actually, it was up higher  than that but that he has lost some weight recently.  Though he has been more compliant with his medications, he does not routinely take either aspirin or statin.  A friend told him that statins cause dementia, thus prompting him not to take his rosuvastatin.  The patient does not have symptoms concerning for COVID-19 infection (fever, chills, cough, or new shortness of breath).  He mostly stays at home but about once a week he meets up with some friends to putt golf balls.  This is always outside.  He says he tries to keep a distance, but they never wear masks.  Home Medications    Prior to Admission medications   Medication Sig Start Date End Date Taking? Authorizing Provider  amLODipine (NORVASC) 5 MG tablet Take 1 tablet (5 mg total) by mouth daily. Pt needs to contact office to schedule future appointment and refills, Thank you. [id number] 08/20/19  Yes Wellington Hampshire, MD  lisinopril (ZESTRIL) 40 MG tablet TAKE ONE (1) TABLET EACH DAY 01/02/19  Yes Wellington Hampshire, MD  metoprolol tartrate (LOPRESSOR) 25 MG tablet TAKE ONE TABLET EVERY 12 HOURS 08/20/19  Yes Wellington Hampshire, MD  nitroGLYCERIN (NITROSTAT) 0.4 MG SL tablet Place 1 tablet (0.4 mg total) under the tongue every 5 (five) minutes as needed for chest pain. 08/10/18  Yes Theora Gianotti, NP  rosuvastatin (CRESTOR) 20 MG tablet Take 1 tablet (20 mg total) by mouth daily. 08/10/18  Yes Theora Gianotti, NP    Review of Systems    Occasional indigestion after meals.  He denies any exertional chest pain, dyspnea, palpitations, PND, orthopnea, dizziness, syncope, edema, or early satiety.  All other systems reviewed and are otherwise negative except as noted above.  Physical Exam    Vital Signs:  Ht 5' 10.5" (1.791 m)   Wt 270 lb (122.5 kg)   BMI 38.19 kg/m    Exam limited secondary to telephonic nature of visit.  He is awake, alert, and oriented x3.  Speech is unlabored.  Accessory Clinical Findings   None     Assessment & Plan    1.  CAD:  S/p Inferior MI in 2014 w/ thrombectomy and DES to the occluded RCA @ that time.  He has known mild residual LAD and LCX dzs.  Neg MV in 2015.  Over the past year, he has not been experiencing any exertional chest pain or dyspnea.  He has gained 16 pounds since his visit 1 year ago and notes that he has recently been trying to increase his activity.  He has been noncompliant with aspirin and statin therapy.  I have encouraged him to resume both and have also encouraged increased activity and caloric restriction with a focus on weight loss.  He remains on beta-blocker therapy, which was refilled today.  2.  Essential HTN: He does not routinely check his blood pressure though has been compliant with amlodipine, lisinopril, and metoprolol.  3.  HL:  Last known lipids from March 2019-total cholesterol 150, triglycerides 131, HDL 33, LDL 91.  He was supposed to have  follow-up labs with primary care last March but he does not think that he had these performed.  Regardless, he has not been taking rosuvastatin as a friend told him that it causes dementia.  I reviewed with him today that there is some evidence that certain patients may develop reversible cognitive deficits on statin therapy however others, may also experience reduce risks of dementia on statin therapy.  In all, the data is inconclusive in either direction.  Given his family and personal history of premature CAD, with other risk factors including hypertension, hyperlipidemia, and morbid obesity, I encouraged him to resume statin therapy as his risk of MI or recurrent cardiac event outweighs any potential risk of memory deficits secondary to a statin.  He was agreeable to resume Crestor.  4.  Morbid obesity: Patient remains relatively sedentary and is up 16 pounds since his last visit.  I encouraged caloric restriction and increased activity with a goal of 1 pound weight loss per week over the next year.  5.   Disposition: Follow-up in clinic in 6 months or sooner if necessary.  COVID-19 Education: The signs and symptoms of COVID-19 were discussed with the patient and how to seek care for testing (follow up with PCP or arrange E-visit).  The importance of social distancing was discussed today.  He admitted to meeting with friends outside, approximately once a week.  Unfortunately, they are not wearing masks when meeting up, and I encouraged him to do so.  I have also strongly encouraged him to sign up for COVID19 vaccination.  Patient Risk:   After full review of this patient's history and clinical status, I feel that he is at least moderate risk for cardiac complications at this time, thus necessitating a telehealth visit sooner than our first available in office visit.  Time:   Today, I have spent 17 minutes with the patient with telehealth technology discussing medical history, symptoms, and management plan.     Murray Hodgkins, NP 08/23/2019, 11:12 AM

## 2019-10-08 ENCOUNTER — Other Ambulatory Visit: Payer: Self-pay | Admitting: Cardiovascular Disease

## 2020-02-20 ENCOUNTER — Other Ambulatory Visit: Payer: Self-pay | Admitting: Cardiovascular Disease

## 2020-02-21 ENCOUNTER — Telehealth: Payer: BLUE CROSS/BLUE SHIELD | Admitting: Cardiovascular Disease

## 2020-04-14 ENCOUNTER — Other Ambulatory Visit: Payer: Self-pay | Admitting: Cardiovascular Disease

## 2020-04-28 ENCOUNTER — Telehealth: Payer: Self-pay | Admitting: Cardiovascular Disease

## 2020-04-28 ENCOUNTER — Encounter: Payer: Self-pay | Admitting: Cardiovascular Disease

## 2020-04-28 NOTE — Telephone Encounter (Signed)
  Patient Consent for Virtual Visit         [name] has provided verbal consent on 04/28/2020 for a virtual visit (video or telephone).   CONSENT FOR VIRTUAL VISIT FOR:  [name]  By participating in this virtual visit I agree to the following:  I hereby voluntarily request, consent and authorize [facility] and its employed or contracted physicians, physician assistants, nurse practitioners or other licensed health care professionals (the Practitioner), to provide me with telemedicine health care services (the "Services") as deemed necessary by the treating Practitioner. I acknowledge and consent to receive the Services by the Practitioner via telemedicine. I understand that the telemedicine visit will involve communicating with the Practitioner through live audiovisual communication technology and the disclosure of certain medical information by electronic transmission. I acknowledge that I have been given the opportunity to request an in-person assessment or other available alternative prior to the telemedicine visit and am voluntarily participating in the telemedicine visit.  I understand that I have the right to withhold or withdraw my consent to the use of telemedicine in the course of my care at any time, without affecting my right to future care or treatment, and that the Practitioner or I may terminate the telemedicine visit at any time. I understand that I have the right to inspect all information obtained and/or recorded in the course of the telemedicine visit and may receive copies of available information for a reasonable fee.  I understand that some of the potential risks of receiving the Services via telemedicine include:  [name] Delay or interruption in medical evaluation due to technological equipment failure or disruption; . Information transmitted may not be sufficient (e.g. poor resolution of images) to allow for appropriate medical decision making by the Practitioner;  and/or  . In rare instances, security protocols could fail, causing a breach of personal health information.  Furthermore, I acknowledge that it is my responsibility to provide information about my medical history, conditions and care that is complete and accurate to the best of my ability. I acknowledge that Practitioner's advice, recommendations, and/or decision may be based on factors not within their control, such as incomplete or inaccurate data provided by me or distortions of diagnostic images or specimens that may result from electronic transmissions. I understand that the practice of medicine is not an exact science and that Practitioner makes no warranties or guarantees regarding treatment outcomes. I acknowledge that a copy of this consent can be made available to me via my patient portal ([facility]), or I can request a printed copy by calling the office of [place].    I understand that my insurance will be billed for this visit.   I have read or had this consent read to me. . I understand the contents of this consent, which adequately explains the benefits and risks of the Services being provided via telemedicine.  . I have been provided ample opportunity to ask questions regarding this consent and the Services and have had my questions answered to my satisfaction. . I give my informed consent for the services to be provided through the use of telemedicine in my medical care

## 2020-04-30 NOTE — Progress Notes (Signed)
This encounter was created in error - please disregard.

## 2020-05-01 ENCOUNTER — Other Ambulatory Visit: Payer: Self-pay

## 2020-05-01 ENCOUNTER — Encounter: Payer: Self-pay | Admitting: Cardiovascular Disease

## 2020-05-01 ENCOUNTER — Telehealth (INDEPENDENT_AMBULATORY_CARE_PROVIDER_SITE_OTHER): Payer: Medicare Other | Admitting: Cardiovascular Disease

## 2020-05-01 VITALS — Ht 70.5 in | Wt 281.0 lb

## 2020-05-01 DIAGNOSIS — I2510 Atherosclerotic heart disease of native coronary artery without angina pectoris: Secondary | ICD-10-CM | POA: Diagnosis not present

## 2020-05-01 DIAGNOSIS — E7800 Pure hypercholesterolemia, unspecified: Secondary | ICD-10-CM | POA: Diagnosis not present

## 2020-05-01 DIAGNOSIS — I10 Essential (primary) hypertension: Secondary | ICD-10-CM | POA: Diagnosis not present

## 2020-05-01 NOTE — Progress Notes (Signed)
Virtual Visit via Telephone Note   This visit type was conducted due to national recommendations for restrictions regarding the COVID-19 Pandemic (e.g. social distancing) in an effort to limit this patient's exposure and mitigate transmission in our community.  Due to his co-morbid illnesses, this patient is at least at moderate risk for complications without adequate follow up.  This format is felt to be most appropriate for this patient at this time.  The patient did not have access to video technology/had technical difficulties with video requiring transitioning to audio format only (telephone).  All issues noted in this document were discussed and addressed.  No physical exam could be performed with this format.  Please refer to the patient's chart for his  consent to telehealth for [facility].    Date:  05/01/2020   ID:  [name], DOB 1956/05/14, MRN [id number] The patient was identified using 2 identifiers.  Patient Location: Home Provider Location: Office/Clinic  PCP:  Danelle Berry, NP  Cardiologist:  Kathlyn Sacramento, MD  Electrophysiologist:  None   Evaluation Performed:  Follow-Up Visit  Chief Complaint: Congestion, productive cough and right-sided chest pain  History of Present Illness:    [name] is a 64 y.o. male who was reached via phone for a follow-up visit regarding coronary artery disease. He presented in 06/2014with acute inferior ST elevation myocardial infarction. Cardiac catheterization showed an occluded proximal RCA with mild disease in the left circumflex and LAD. He underwent successful thrombectomy and drug-eluting stent placement to the RCA without complications. Ejection fraction was normal.  He quit smoking since that event. He did have elevated liver enzymes on atorvastatin . He had exertional chest pain in April of 2015. He underwent a nuclear stress test which showed no evidence of ischemia with normal ejection fraction. He has sleep  apnea but could not afford CPAP machine. He had gradual weight gain over the last few years but reports losing 6 pounds from his peak weight.  He had recent congestion, productive cough and right-sided chest pain with coughing over the last few days.  He is going to see his primary care physician tomorrow. He has been more consistent taking his medications although he frequently forgets to take aspirin and the afternoon dose of metoprolol.      Past Medical History:  Diagnosis Date  . Coronary artery disease    a. 12/2012 Acute inferior STEMI/PCI: occluded proximal RCA with mild LAD/left circumflex disease. Successful thrombectomy and drug-eluting stent placement to the proximal RCA. EF nl; b. 10/2013 MV: no ischemia/infarct.  . Essential hypertension   . Hyperlipidemia   . Morbid obesity ([place])   . Near syncope    a. ER visit 01/2015 - ? etiology.  . Nonadherence to medication   . Stomach ulcer   . Vasculitis ([place]) 2017   Treated with prednisone.   Past Surgical History:  Procedure Laterality Date  . APPENDECTOMY    . CARDIAC CATHETERIZATION  6/14   [facility];EF 65%  . COLONOSCOPY WITH PROPOFOL [name] 08/18/2016   Procedure: COLONOSCOPY WITH PROPOFOL;  Surgeon: Manya Silvas, MD;  Location: [facility] ENDOSCOPY;  Service: Endoscopy;  Laterality: [name];  . HERNIA REPAIR       Current Meds  Medication Sig  . amLODipine (NORVASC) 5 MG tablet Take 1 tablet (5 mg total) by mouth daily. Pt needs to contact office to schedule future appointment and refills, Thank you. [phone]  . aspirin EC 81 MG tablet Take 1 tablet (81 mg  total) by mouth daily.  [name] lisinopril (ZESTRIL) 40 MG tablet TAKE 1 TABLET BY MOUTH DAILY  . metoprolol tartrate (LOPRESSOR) 25 MG tablet TAKE 1 TABLET BY MOUTH EVERY 12 HOURS  . nitroGLYCERIN (NITROSTAT) 0.4 MG SL tablet Place 1 tablet (0.4 mg total) under the tongue every 5 (five) minutes as needed for chest pain.  . rosuvastatin (CRESTOR) 20 MG tablet TAKE 1 TABLET BY MOUTH  DAILY     Allergies:   Latex   Social History   Tobacco Use  . Smoking status: Former Smoker    Packs/day: 1.00    Years: 30.00    Pack years: 30.00    Types: Cigarettes    Quit date: 12/18/2012    Years since quitting: 7.3  . Smokeless tobacco: Never Used  Vaping Use  . Vaping Use: Never used  Substance Use Topics  . Alcohol use: No    Comment: history of ETOH last drink 1998  . Drug use: No    Comment: past marijuana     Family Hx: The patient's family history includes Heart disease in his father; Heart failure in his father.  ROS:   Please see the history of present illness.     All other systems reviewed and are negative.   Prior CV studies:   The following studies were reviewed today:    Labs/Other Tests and Data Reviewed:    EKG:  No ECG reviewed.  Recent Labs: No results found for requested labs within last 8760 hours.   Recent Lipid Panel Lab Results  Component Value Date/Time   CHOL 107 01/26/2016 07:39 AM   CHOL 212 (H) 12/19/2012 06:01 AM   TRIG 105 01/26/2016 07:39 AM   TRIG 142 12/19/2012 06:01 AM   HDL 40 01/26/2016 07:39 AM   HDL 31 (L) 12/19/2012 06:01 AM   CHOLHDL 2.7 01/26/2016 07:39 AM   LDLCALC 46 01/26/2016 07:39 AM   LDLCALC 153 (H) 12/19/2012 06:01 AM    Wt Readings from Last 3 Encounters:  05/01/20 281 lb (127.5 kg)  08/23/19 270 lb (122.5 kg)  08/10/18 254 lb 12 oz (115.6 kg)     Risk Assessment/Calculations:      Objective:    Vital Signs:  Ht 5' 10.5" (1.791 m)   Wt 281 lb (127.5 kg)   BMI 39.75 kg/m      ASSESSMENT & PLAN:     1.  Coronary artery disease involving native coronary arteries without angina:He is overall doing reasonably well with no anginal symptoms.  I discussed with him the importance of taking aspirin regularly.  His current right-sided chest pain does not seem to be cardiac and seems to be pleuritic in the setting of what seems to be recent upper respiratory tract infection.  He is going to  see his primary care physician tomorrow.  2. Essential hypertension: He is not able to check his blood pressure.  Continue same medications for now.  His next follow-up visit with [place] should be in office and not virtual in 4 months.  3. Hyperlipidemia: Continue rosuvastatin with a target LDL of less than 70.  4.  Obesity: Gradual weight gain over the last few years but he is now trying to lose weight with healthier lifestyle.    COVID-19 Education: The signs and symptoms of COVID-19 were discussed with the patient and how to seek care for testing (follow up with PCP or arrange E-visit).  The importance of social distancing was discussed today.  Time:  Today, I have spent 10 minutes with the patient with telehealth technology discussing the above problems.     Medication Adjustments/Labs and Tests Ordered: Current medicines are reviewed at length with the patient today.  Concerns regarding medicines are outlined above.   Tests Ordered: No orders of the defined types were placed in this encounter.   Medication Changes: No orders of the defined types were placed in this encounter.   Follow Up:  In Person in 4 month(s)  Signed, Kathlyn Sacramento, MD  05/01/2020 11:14 AM    [place]

## 2020-05-01 NOTE — Patient Instructions (Signed)
Medication Instructions:  Your physician recommends that you continue on your current medications as directed. Please refer to the Current Medication list given to you today.  *If you need a refill on your cardiac medications before your next appointment, please call your pharmacy*   Lab Work: None ordered If you have labs (blood work) drawn today and your tests are completely normal, you will receive your results only by: [name] MyChart Message (if you have MyChart) OR . A paper copy in the mail If you have any lab test that is abnormal or we need to change your treatment, we will call you to review the results.   Testing/Procedures: None ordered   Follow-Up: At [facility], you and your health needs are our priority.  As part of our continuing mission to provide you with exceptional heart care, we have created designated Provider Care Teams.  These Care Teams include your primary Cardiologist (physician) and Advanced Practice Providers (APPs -  Physician Assistants and Nurse Practitioners) who all work together to provide you with the care you need, when you need it.  We recommend signing up for the patient portal called "MyChart".  Sign up information is provided on this After Visit Summary.  MyChart is used to connect with patients for Virtual Visits (Telemedicine).  Patients are able to view lab/test results, encounter notes, upcoming appointments, etc.  Non-urgent messages can be sent to your provider as well.   To learn more about what you can do with MyChart, go to [email or web address].    Your next appointment:   4 month(s)  The format for your next appointment:   In Person  Provider:   Kathlyn Sacramento, MD   Other Instructions [name]

## 2020-06-12 ENCOUNTER — Other Ambulatory Visit: Payer: Self-pay | Admitting: Cardiovascular Disease

## 2020-06-13 ENCOUNTER — Other Ambulatory Visit: Payer: Self-pay

## 2020-06-13 MED ORDER — METOPROLOL TARTRATE 25 MG PO TABS: 25.0000 mg | ORAL_TABLET | Freq: Two times a day (BID) | ORAL | 0 refills | Status: DC

## 2020-07-16 ENCOUNTER — Other Ambulatory Visit: Payer: Self-pay | Admitting: Cardiovascular Disease

## 2020-07-16 NOTE — Telephone Encounter (Signed)
Requested Prescriptions   Signed Prescriptions Disp Refills   lisinopril (ZESTRIL) 40 MG tablet 90 tablet 0    Sig: TAKE 1 TABLET BY MOUTH DAILY    Authorizing Provider: Kathlyn Sacramento A    Ordering User: Britt Bottom

## 2020-08-22 ENCOUNTER — Other Ambulatory Visit: Payer: Self-pay | Admitting: Cardiovascular Disease

## 2020-08-22 ENCOUNTER — Telehealth: Payer: Self-pay | Admitting: Cardiovascular Disease

## 2020-08-22 NOTE — Telephone Encounter (Signed)
*  STAT* If patient is at the pharmacy, call can be transferred to refill team.   1. Which medications need to be refilled? (please list name of each medication and dose if known)  Amlodipine 5 mg po q d   2. Which pharmacy/location (including street and city if local pharmacy) is medication to be sent to?[facility]   3. Do they need a 30 day or 90 day supply? [place]

## 2020-08-22 NOTE — Telephone Encounter (Signed)
Medication refilled as requested.

## 2020-09-04 ENCOUNTER — Ambulatory Visit: Payer: Medicare HMO | Admitting: Cardiovascular Disease

## 2020-09-04 ENCOUNTER — Other Ambulatory Visit: Payer: Self-pay

## 2020-09-04 ENCOUNTER — Encounter: Payer: Self-pay | Admitting: Cardiovascular Disease

## 2020-09-04 VITALS — BP 144/66 | HR 71 | Ht 70.5 in | Wt 265.0 lb

## 2020-09-04 DIAGNOSIS — E785 Hyperlipidemia, unspecified: Secondary | ICD-10-CM | POA: Diagnosis not present

## 2020-09-04 DIAGNOSIS — I10 Essential (primary) hypertension: Secondary | ICD-10-CM

## 2020-09-04 DIAGNOSIS — I2510 Atherosclerotic heart disease of native coronary artery without angina pectoris: Secondary | ICD-10-CM

## 2020-09-04 MED ORDER — CARVEDILOL 6.25 MG PO TABS: 6.2500 mg | ORAL_TABLET | Freq: Two times a day (BID) | ORAL | 3 refills | Status: DC

## 2020-09-04 NOTE — Patient Instructions (Signed)
Medication Instructions:  Your physician has recommended you make the following change in your medication:   1) STOP Metoprolol  2) START Carvedilol 6.25 mg twice daily. An Rx has been sent to your pharmacy.  *If you need a refill on your cardiac medications before your next appointment, please call your pharmacy*   Lab Work: None ordered If you have labs (blood work) drawn today and your tests are completely normal, you will receive your results only by: [name] MyChart Message (if you have MyChart) OR . A paper copy in the mail If you have any lab test that is abnormal or we need to change your treatment, we will call you to review the results.   Testing/Procedures: None ordered   Follow-Up: At [facility], you and your health needs are our priority.  As part of our continuing mission to provide you with exceptional heart care, we have created designated Provider Care Teams.  These Care Teams include your primary Cardiologist (physician) and Advanced Practice Providers (APPs -  Physician Assistants and Nurse Practitioners) who all work together to provide you with the care you need, when you need it.  We recommend signing up for the patient portal called "MyChart".  Sign up information is provided on this After Visit Summary.  MyChart is used to connect with patients for Virtual Visits (Telemedicine).  Patients are able to view lab/test results, encounter notes, upcoming appointments, etc.  Non-urgent messages can be sent to your provider as well.   To learn more about what you can do with MyChart, go to [email or web address].    Your next appointment:   6 month(s)  The format for your next appointment:   In Person  Provider:   You may see Kathlyn Sacramento, MD or one of the following Advanced Practice Providers on your designated Care Team:    Murray Hodgkins, NP  Christell Faith, PA-C  Marrianne Mood, PA-C  Cadence [place], [place]  Laurann Montana, NP    Other  Instructions [name]

## 2020-09-04 NOTE — Progress Notes (Signed)
Cardiology Office Note   Date:  09/04/2020   ID:  Alan, Robinson May 10, 1956, MRN [id number]  PCP:  Danelle Berry, NP  Cardiologist:   Kathlyn Sacramento, MD   Chief Complaint  Patient presents with  . Follow-up    4 months---states he has had 2 events of mild chest pain, felt like it may have been gas though and did not last long.      History of Present Illness: [name] is a 65 y.o. male who presents for a follow-up visit regarding coronary artery disease. He presented in 12/2012 with acute inferior ST elevation myocardial infarction. Cardiac catheterization showed an occluded proximal RCA with mild disease in the left circumflex and LAD. He underwent successful thrombectomy and drug-eluting stent placement to the RCA without complications. Ejection fraction was normal.  He quit smoking since that event. He did have elevated liver enzymes on atorvastatin . He had exertional chest pain in April of 2015. He underwent a nuclear stress test which showed no evidence of ischemia with normal ejection fraction. He has sleep apnea but could not afford CPAP machine.  He has been doing reasonably well overall.  He reports few episodes of chest discomfort over the last year but none recently.  Exertional dyspnea is stable.  He was diagnosed with type 2 diabetes and was started on Metformin.   Past Medical History:  Diagnosis Date  . Coronary artery disease    a. 12/2012 Acute inferior STEMI/PCI: occluded proximal RCA with mild LAD/left circumflex disease. Successful thrombectomy and drug-eluting stent placement to the proximal RCA. EF nl; b. 10/2013 MV: no ischemia/infarct.  . Essential hypertension   . Hyperlipidemia   . Morbid obesity ([place])   . Near syncope    a. ER visit 01/2015 - ? etiology.  . Nonadherence to medication   . Stomach ulcer   . Vasculitis ([place]) 2017   Treated with prednisone.    Past Surgical History:  Procedure Laterality Date  . APPENDECTOMY    .  CARDIAC CATHETERIZATION  6/14   [facility];EF 65%  . COLONOSCOPY WITH PROPOFOL [name] 08/18/2016   Procedure: COLONOSCOPY WITH PROPOFOL;  Surgeon: Manya Silvas, MD;  Location: [facility] ENDOSCOPY;  Service: Endoscopy;  Laterality: [name];  . HERNIA REPAIR       Current Outpatient Medications  Medication Sig Dispense Refill  . amLODipine (NORVASC) 5 MG tablet TAKE 1 TABLET BY MOUTH DAILY 90 tablet 0  . aspirin EC 81 MG tablet Take 1 tablet (81 mg total) by mouth daily. 90 tablet 3  . lisinopril (ZESTRIL) 40 MG tablet TAKE 1 TABLET BY MOUTH DAILY 90 tablet 0  . metFORMIN (GLUCOPHAGE) 500 MG tablet Take 500 mg by mouth 2 (two) times daily with a meal.    . metoprolol tartrate (LOPRESSOR) 25 MG tablet Take 1 tablet (25 mg total) by mouth every 12 (twelve) hours. 180 tablet 0  . nitroGLYCERIN (NITROSTAT) 0.4 MG SL tablet Place 1 tablet (0.4 mg total) under the tongue every 5 (five) minutes as needed for chest pain. 25 tablet 3  . rosuvastatin (CRESTOR) 20 MG tablet TAKE 1 TABLET BY MOUTH DAILY 90 tablet 3   No current facility-administered medications for this visit.    Allergies:   Latex    Social History:  The patient  reports that he quit smoking about 7 years ago. His smoking use included cigarettes. He has a 30.00 pack-year smoking history. He has never used smokeless tobacco. He reports that  he does not drink alcohol and does not use drugs.   Family History:  The patient's family history includes Heart disease in his father; Heart failure in his father.    ROS:  Please see the history of present illness.   Otherwise, review of systems are positive for none.   All other systems are reviewed and negative.    PHYSICAL EXAM: VS:  BP (!) 144/66   Pulse 71   Ht 5' 10.5" (1.791 m)   Wt 265 lb (120.2 kg)   BMI 37.49 kg/m  , BMI Body mass index is 37.49 kg/m. GEN: Well nourished, well developed, in no acute distress  HEENT: normal  Neck: no JVD, carotid bruits, or masses Cardiac: RRR; no rubs,  or gallops,no edema .  2 out of 6 systolic murmur in the aortic area Respiratory:  clear to auscultation bilaterally, normal work of breathing GI: soft, nontender, nondistended, + BS MS: no deformity or atrophy  Skin: warm and dry, no rash Neuro:  Strength and sensation are intact Psych: euthymic mood, full affect   EKG:  EKG is ordered today. The ekg ordered today demonstrates normal sinus rhythm with no significant ST or T wave changes.   Recent Labs: No results found for requested labs within last 8760 hours.    Lipid Panel    Component Value Date/Time   CHOL 107 01/26/2016 0739   CHOL 212 (H) 12/19/2012 0601   TRIG 105 01/26/2016 0739   TRIG 142 12/19/2012 0601   HDL 40 01/26/2016 0739   HDL 31 (L) 12/19/2012 0601   CHOLHDL 2.7 01/26/2016 0739   VLDL 28 12/19/2012 0601   LDLCALC 46 01/26/2016 0739   LDLCALC 153 (H) 12/19/2012 0601      Wt Readings from Last 3 Encounters:  09/04/20 265 lb (120.2 kg)  05/01/20 281 lb (127.5 kg)  08/23/19 270 lb (122.5 kg)        No flowsheet data found.    ASSESSMENT AND PLAN:   1.  Coronary artery disease involving native coronary arteries without angina:He is overall doing reasonably well with no anginal symptoms.  Continue medical therapy.  2. Essential hypertension: Blood pressure is elevated and given his diabetic status, I elected to switch him from metoprolol to carvedilol 6.25 mg twice daily.  3. Hyperlipidemia: Continue rosuvastatin with a target LDL of less than 70.  He gets this checked with his primary care physician and his LDL in the past has been consistently below 70.  4.  Obesity: I discussed with him the importance of healthy lifestyle changes and regular exercise.  5.  Cardiac murmur: Suggestive of aortic sclerosis or at most mild stenosis.  Will consider an echocardiogram later this year.   Disposition:   FU with me in 6 months  Signed,  Kathlyn Sacramento, MD  09/04/2020 9:10 AM    [place]

## 2020-10-09 ENCOUNTER — Other Ambulatory Visit: Payer: Self-pay | Admitting: Cardiovascular Disease

## 2020-11-25 ENCOUNTER — Other Ambulatory Visit: Payer: Self-pay | Admitting: Cardiovascular Disease

## 2020-12-18 ENCOUNTER — Telehealth: Payer: Self-pay | Admitting: Cardiovascular Disease

## 2020-12-18 MED ORDER — ROSUVASTATIN CALCIUM 20 MG PO TABS: 20.0000 mg | ORAL_TABLET | Freq: Every day | ORAL | 0 refills | Status: DC

## 2020-12-18 NOTE — Telephone Encounter (Signed)
Requested Prescriptions   Signed Prescriptions Disp Refills   rosuvastatin (CRESTOR) 20 MG tablet 90 tablet 0    Sig: Take 1 tablet (20 mg total) by mouth daily.    Authorizing Provider: ARIDA, MUHAMMAD A    Ordering User: NEWCOMER MCCLAIN, Zykira Matlack L    

## 2020-12-18 NOTE — Telephone Encounter (Signed)
*  STAT* If patient is at the pharmacy, call can be transferred to refill team.   1. Which medications need to be refilled? (please list name of each medication and dose if known) rosuvastatin   2. Which pharmacy/location (including street and city if local pharmacy) is medication to be sent to?[facility]    3. Do they need a 30 day or 90 day supply? [place]

## 2021-01-08 DIAGNOSIS — E119 Type 2 diabetes mellitus without complications: Secondary | ICD-10-CM | POA: Diagnosis not present

## 2021-01-08 DIAGNOSIS — E785 Hyperlipidemia, unspecified: Secondary | ICD-10-CM | POA: Diagnosis not present

## 2021-01-08 DIAGNOSIS — E669 Obesity, unspecified: Secondary | ICD-10-CM | POA: Diagnosis not present

## 2021-01-08 DIAGNOSIS — I10 Essential (primary) hypertension: Secondary | ICD-10-CM | POA: Diagnosis not present

## 2021-01-12 DIAGNOSIS — E663 Overweight: Secondary | ICD-10-CM | POA: Diagnosis not present

## 2021-01-12 DIAGNOSIS — I10 Essential (primary) hypertension: Secondary | ICD-10-CM | POA: Diagnosis not present

## 2021-01-12 DIAGNOSIS — R7303 Prediabetes: Secondary | ICD-10-CM | POA: Diagnosis not present

## 2021-01-12 DIAGNOSIS — R945 Abnormal results of liver function studies: Secondary | ICD-10-CM | POA: Diagnosis not present

## 2021-01-12 DIAGNOSIS — E785 Hyperlipidemia, unspecified: Secondary | ICD-10-CM | POA: Diagnosis not present

## 2021-01-16 ENCOUNTER — Other Ambulatory Visit: Payer: Self-pay | Admitting: Cardiovascular Disease

## 2021-02-27 ENCOUNTER — Other Ambulatory Visit: Payer: Self-pay | Admitting: Cardiovascular Disease

## 2021-03-10 ENCOUNTER — Encounter: Payer: Self-pay | Admitting: Cardiovascular Disease

## 2021-03-10 ENCOUNTER — Other Ambulatory Visit: Payer: Self-pay

## 2021-03-10 ENCOUNTER — Ambulatory Visit: Payer: Medicare HMO | Admitting: Cardiovascular Disease

## 2021-03-10 VITALS — BP 140/60 | HR 76 | Ht 70.5 in | Wt 270.2 lb

## 2021-03-10 DIAGNOSIS — I10 Essential (primary) hypertension: Secondary | ICD-10-CM

## 2021-03-10 DIAGNOSIS — E785 Hyperlipidemia, unspecified: Secondary | ICD-10-CM | POA: Diagnosis not present

## 2021-03-10 DIAGNOSIS — R011 Cardiac murmur, unspecified: Secondary | ICD-10-CM | POA: Diagnosis not present

## 2021-03-10 DIAGNOSIS — I2510 Atherosclerotic heart disease of native coronary artery without angina pectoris: Secondary | ICD-10-CM | POA: Diagnosis not present

## 2021-03-10 MED ORDER — CARVEDILOL 12.5 MG PO TABS
12.5000 mg | ORAL_TABLET | Freq: Two times a day (BID) | ORAL | 2 refills | Status: DC
Start: 2021-03-10 — End: 2022-02-11

## 2021-03-10 NOTE — Patient Instructions (Signed)
Medication Instructions:  Your physician has recommended you make the following change in your medication:   INCREASE Carvedilol to 12.5 mg twice a day. An Rx has been sent to your pharmacy.   *If you need a refill on your cardiac medications before your next appointment, please call your pharmacy*   Lab Work: None ordered If you have labs (blood work) drawn today and your tests are completely normal, you will receive your results only by: [facility] (if you have MyChart) OR A paper copy in the mail If you have any lab test that is abnormal or we need to change your treatment, we will call you to review the results.   Testing/Procedures: Your physician has requested that you have an echocardiogram. Echocardiography is a painless test that uses sound waves to create images of your heart. It provides your doctor with information about the size and shape of your heart and how well your heart's chambers and valves are working. This procedure takes approximately one hour. There are no restrictions for this procedure.    Follow-Up: At [facility], you and your health needs are our priority.  As part of our continuing mission to provide you with exceptional heart care, we have created designated Provider Care Teams.  These Care Teams include your primary Cardiologist (physician) and Advanced Practice Providers (APPs -  Physician Assistants and Nurse Practitioners) who all work together to provide you with the care you need, when you need it.  We recommend signing up for the patient portal called "MyChart".  Sign up information is provided on this After Visit Summary.  MyChart is used to connect with patients for Virtual Visits (Telemedicine).  Patients are able to view lab/test results, encounter notes, upcoming appointments, etc.  Non-urgent messages can be sent to your provider as well.   To learn more about what you can do with MyChart, go to [email or web address].    Your next  appointment:   6 month(s)  The format for your next appointment:   In Person  Provider:   You may see Kathlyn Sacramento, MD or one of the following Advanced Practice Providers on your designated Care Team:   Murray Hodgkins, NP Christell Faith, PA-C Marrianne Mood, PA-C Cadence Kathlen Mody, [place]   Other Instructions [name]

## 2021-03-10 NOTE — Progress Notes (Signed)
Cardiology Office Note   Date:  03/10/2021   ID:  [name], DOB Feb 07, 1956, MRN [id number]  PCP:  Danelle Berry, NP  Cardiologist:   Kathlyn Sacramento, MD   Chief Complaint  Patient presents with   Other    6 month f/u no complaints today. Meds reviewed verbally with pt.       History of Present Illness: KA POLISHCHUK is a 65 y.o. male who presents for a follow-up visit regarding coronary artery disease. He presented in 12/2012 with acute inferior ST elevation myocardial infarction. Cardiac catheterization showed an occluded proximal RCA with mild disease in the left circumflex and LAD. He underwent successful thrombectomy and drug-eluting stent placement to the RCA without complications. Ejection fraction was normal.  He quit smoking since that event. He did have elevated liver enzymes on atorvastatin . He had exertional chest pain in April of 2015. He underwent a nuclear stress test which showed no evidence of ischemia with normal ejection fraction. He has sleep apnea but could not afford CPAP machine.  During last visit, he was switched from metoprolol to carvedilol for better blood pressure control.  He has been tolerating the medications.  He denies any chest pain or worsening dyspnea.  Unfortunately, he continues to gain weight due to poor diet choices and lack of exercise.   Past Medical History:  Diagnosis Date   Coronary artery disease    a. 12/2012 Acute inferior STEMI/PCI: occluded proximal RCA with mild LAD/left circumflex disease. Successful thrombectomy and drug-eluting stent placement to the proximal RCA. EF nl; b. 10/2013 MV: no ischemia/infarct.   Essential hypertension    Hyperlipidemia    Morbid obesity ([place])    Near syncope    a. ER visit 01/2015 - ? etiology.   Nonadherence to medication    Stomach ulcer    Vasculitis ([place]) 2017   Treated with prednisone.    Past Surgical History:  Procedure Laterality Date   APPENDECTOMY     CARDIAC  CATHETERIZATION  6/14   [facility];EF 65%   COLONOSCOPY WITH PROPOFOL [name] 08/18/2016   Procedure: COLONOSCOPY WITH PROPOFOL;  Surgeon: Manya Silvas, MD;  [facility] ENDOSCOPY;  Service: Endoscopy;  Laterality: [name];   HERNIA REPAIR       Current Outpatient Medications  Medication Sig Dispense Refill   amLODipine (NORVASC) 5 MG tablet TAKE 1 TABLET BY MOUTH DAILY 90 tablet 0   aspirin EC 81 MG tablet Take 1 tablet (81 mg total) by mouth daily. 90 tablet 3   carvedilol (COREG) 6.25 MG tablet Take 1 tablet (6.25 mg total) by mouth 2 (two) times daily. 180 tablet 3   lisinopril (ZESTRIL) 40 MG tablet TAKE 1 TABLET BY MOUTH DAILY 90 tablet 0   metFORMIN (GLUCOPHAGE) 500 MG tablet Take 500 mg by mouth 2 (two) times daily with a meal.     nitroGLYCERIN (NITROSTAT) 0.4 MG SL tablet Place 1 tablet (0.4 mg total) under the tongue every 5 (five) minutes as needed for chest pain. 25 tablet 3   rosuvastatin (CRESTOR) 20 MG tablet Take 1 tablet (20 mg total) by mouth daily. 90 tablet 0   No current facility-administered medications for this visit.    Allergies:   Latex    Social History:  The patient  reports that he quit smoking about 8 years ago. His smoking use included cigarettes. He has a 30.00 pack-year smoking history. He has never used smokeless tobacco. He reports that he does not  drink alcohol and does not use drugs.   Family History:  The patient's family history includes Heart disease in his father; Heart failure in his father.    ROS:  Please see the history of present illness.   Otherwise, review of systems are positive for none.   All other systems are reviewed and negative.    PHYSICAL EXAM: VS:  BP 140/60 (BP Location: Left Arm, Patient Position: Sitting, Cuff Size: Large)   Pulse 76   Ht 5' 10.5" (1.791 m)   Wt 270 lb 4 oz (122.6 kg)   SpO2 97%   BMI 38.23 kg/m  , BMI Body mass index is 38.23 kg/m. GEN: Well nourished, well developed, in no acute distress  HEENT: normal   Neck: no JVD, carotid bruits, or masses Cardiac: RRR; no rubs, or gallops,no edema .  2 /6 systolic murmur in the aortic area Respiratory:  clear to auscultation bilaterally, normal work of breathing GI: soft, nontender, nondistended, + BS MS: no deformity or atrophy  Skin: warm and dry, no rash Neuro:  Strength and sensation are intact Psych: euthymic mood, full affect   EKG:  EKG is ordered today. The ekg ordered today demonstrates normal sinus rhythm with left anterior fascicular block and nonspecific T wave changes.   Recent Labs: No results found for requested labs within last 8760 hours.    Lipid Panel    Component Value Date/Time   CHOL 107 01/26/2016 0739   CHOL 212 (H) 12/19/2012 0601   TRIG 105 01/26/2016 0739   TRIG 142 12/19/2012 0601   HDL 40 01/26/2016 0739   HDL 31 (L) 12/19/2012 0601   CHOLHDL 2.7 01/26/2016 0739   VLDL 28 12/19/2012 0601   LDLCALC 46 01/26/2016 0739   LDLCALC 153 (H) 12/19/2012 0601      Wt Readings from Last 3 Encounters:  03/10/21 270 lb 4 oz (122.6 kg)  09/04/20 265 lb (120.2 kg)  05/01/20 281 lb (127.5 kg)        No flowsheet data found.    ASSESSMENT AND PLAN:   1.  Coronary artery disease involving native coronary arteries without angina:He is overall doing reasonably well with no anginal symptoms.  Continue medical therapy.  2. Essential hypertension: Blood pressure improved after switching to carvedilol but still not at target.  I elected to increase carvedilol to 12.5 mg twice daily.  3. Hyperlipidemia: Continue rosuvastatin with a target LDL of less than 70.  He gets this checked with his primary care physician and his LDL in the past has been consistently below 70.  4.  Obesity: I again discussed with him the importance of healthy lifestyle changes and regular exercise.  5.  Cardiac murmur: No recent evaluation.  I requested an echocardiogram.   Disposition:   FU with me in 6 months  Signed,  Kathlyn Sacramento, MD  03/10/2021 3:44 PM    [place]

## 2021-03-27 ENCOUNTER — Other Ambulatory Visit: Payer: Self-pay | Admitting: Cardiovascular Disease

## 2021-03-27 NOTE — Telephone Encounter (Signed)
*  STAT* If patient is at the pharmacy, call can be transferred to refill team.   1. Which medications need to be refilled? (please list name of each medication and dose if known) Crestor  2. Which pharmacy/location (including street and city if local pharmacy) is medication to be sent to? [facility]   3. Do they need a 30 day or 90 day supply? [place]

## 2021-04-07 ENCOUNTER — Other Ambulatory Visit: Payer: Self-pay

## 2021-04-07 ENCOUNTER — Ambulatory Visit (INDEPENDENT_AMBULATORY_CARE_PROVIDER_SITE_OTHER): Payer: Medicare HMO

## 2021-04-07 DIAGNOSIS — R011 Cardiac murmur, unspecified: Secondary | ICD-10-CM

## 2021-04-07 LAB — ECHOCARDIOGRAM COMPLETE
AR max vel: 3.23 cm2
AV Area VTI: 3.14 cm2
AV Area mean vel: 2.99 cm2
AV Mean grad: 5 mmHg
AV Peak grad: 8 mmHg
Ao pk vel: 1.41 m/s
Area-P 1/2: 3.48 cm2
Calc EF: 57.3 %
S' Lateral: 2.6 cm
Single Plane A2C EF: 57.7 %
Single Plane A4C EF: 60.1 %

## 2021-04-20 ENCOUNTER — Other Ambulatory Visit: Payer: Self-pay | Admitting: Cardiovascular Disease

## 2021-04-24 DIAGNOSIS — R945 Abnormal results of liver function studies: Secondary | ICD-10-CM | POA: Diagnosis not present

## 2021-04-28 DIAGNOSIS — E663 Overweight: Secondary | ICD-10-CM | POA: Diagnosis not present

## 2021-04-28 DIAGNOSIS — E785 Hyperlipidemia, unspecified: Secondary | ICD-10-CM | POA: Diagnosis not present

## 2021-04-28 DIAGNOSIS — R7303 Prediabetes: Secondary | ICD-10-CM | POA: Diagnosis not present

## 2021-04-28 DIAGNOSIS — I10 Essential (primary) hypertension: Secondary | ICD-10-CM | POA: Diagnosis not present

## 2021-05-26 ENCOUNTER — Other Ambulatory Visit: Payer: Self-pay | Admitting: Cardiovascular Disease

## 2021-08-31 ENCOUNTER — Telehealth: Payer: Self-pay | Admitting: Cardiovascular Disease

## 2021-08-31 MED ORDER — AMLODIPINE BESYLATE 5 MG PO TABS: 5.0000 mg | ORAL_TABLET | Freq: Every day | ORAL | 0 refills | Status: DC

## 2021-08-31 NOTE — Telephone Encounter (Signed)
Requested Prescriptions   Signed Prescriptions Disp Refills   amLODipine (NORVASC) 5 MG tablet 90 tablet 0    Sig: Take 1 tablet (5 mg total) by mouth daily.    Authorizing Provider: Kathlyn Sacramento A    Ordering User: Raelene Bott, Shterna Laramee L

## 2021-08-31 NOTE — Telephone Encounter (Signed)
°*  STAT* If patient is at the pharmacy, call can be transferred to refill team.   1. Which medications need to be refilled? (please list name of each medication and dose if known) amlodipine 5 MG 1 tablet daily   2. Which pharmacy/location (including street and city if local pharmacy) is medication to be sent to? [facility]   3. Do they need a 30 day or 90 day supply? 90 day

## 2021-09-04 DIAGNOSIS — E785 Hyperlipidemia, unspecified: Secondary | ICD-10-CM | POA: Diagnosis not present

## 2021-09-04 DIAGNOSIS — R7303 Prediabetes: Secondary | ICD-10-CM | POA: Diagnosis not present

## 2021-09-10 DIAGNOSIS — R7303 Prediabetes: Secondary | ICD-10-CM | POA: Diagnosis not present

## 2021-09-10 DIAGNOSIS — I10 Essential (primary) hypertension: Secondary | ICD-10-CM | POA: Diagnosis not present

## 2021-09-10 DIAGNOSIS — J069 Acute upper respiratory infection, unspecified: Secondary | ICD-10-CM | POA: Diagnosis not present

## 2021-09-10 DIAGNOSIS — E785 Hyperlipidemia, unspecified: Secondary | ICD-10-CM | POA: Diagnosis not present

## 2021-09-15 ENCOUNTER — Ambulatory Visit: Payer: Medicare HMO | Admitting: Cardiovascular Disease

## 2021-09-15 ENCOUNTER — Other Ambulatory Visit: Payer: Self-pay

## 2021-09-15 VITALS — BP 120/60 | HR 84 | Ht 70.5 in | Wt 273.4 lb

## 2021-09-15 DIAGNOSIS — I2510 Atherosclerotic heart disease of native coronary artery without angina pectoris: Secondary | ICD-10-CM | POA: Diagnosis not present

## 2021-09-15 DIAGNOSIS — E119 Type 2 diabetes mellitus without complications: Secondary | ICD-10-CM | POA: Diagnosis not present

## 2021-09-15 DIAGNOSIS — E785 Hyperlipidemia, unspecified: Secondary | ICD-10-CM | POA: Diagnosis not present

## 2021-09-15 DIAGNOSIS — R011 Cardiac murmur, unspecified: Secondary | ICD-10-CM

## 2021-09-15 DIAGNOSIS — I10 Essential (primary) hypertension: Secondary | ICD-10-CM

## 2021-09-15 NOTE — Progress Notes (Signed)
?  ?Cardiology Office Note ? ? ?Date:  09/15/2021  ? ?ID:  [name], DOB 09-18-1955, MRN [id number] ? ?PCP:  Alan Berry, NP  ?Cardiologist:   Alan Sacramento, MD  ? ?Chief Complaint  ?Patient presents with  ? 6 month f/u patient sts that he is doing well  ? ? ? ?  ?History of Present Illness: ?[name] is a 66 y.o. male who presents for a follow-up visit regarding coronary artery disease. ?He has chronic medical conditions including essential hypertension, hyperlipidemia, obesity and type 2 diabetes. ?He presented in 12/2012 with acute inferior ST elevation myocardial infarction. Cardiac catheterization showed an occluded proximal RCA with mild disease in the left circumflex and LAD. He underwent successful thrombectomy and drug-eluting stent placement to the RCA without complications. Ejection fraction was normal.  ?He quit smoking since that event. ?He did have elevated liver enzymes on atorvastatin . ?He had exertional chest pain in April of 2015. He underwent a nuclear stress test which showed no evidence of ischemia with normal ejection fraction. ?He has sleep apnea but could not afford CPAP machine. ?He had an echocardiogram done in October 2022 that showed normal LV systolic function with no significant valvular abnormalities. ?He has been doing very well with no chest pain, shortness of breath or palpitations.  He has not been able to lose any weight. ? ? ?Past Medical History:  ?Diagnosis Date  ? Coronary artery disease   ? a. 12/2012 Acute inferior STEMI/PCI: occluded proximal RCA with mild LAD/left circumflex disease. Successful thrombectomy and drug-eluting stent placement to the proximal RCA. EF nl; b. 10/2013 MV: no ischemia/infarct.  ? Essential hypertension   ? Hyperlipidemia   ? Morbid obesity ([place])   ? Near syncope   ? a. ER visit 01/2015 - ? etiology.  ? Nonadherence to medication   ? Stomach ulcer   ? Vasculitis ([place]) 2017  ? Treated with prednisone.  ? ? ?Past Surgical History:   ?Procedure Laterality Date  ? APPENDECTOMY    ? CARDIAC CATHETERIZATION  6/14  ? [facility];EF 65%  ? COLONOSCOPY WITH PROPOFOL [name] 08/18/2016  ? Procedure: COLONOSCOPY WITH PROPOFOL;  Surgeon: Manya Silvas, MD;  Location: [facility] ENDOSCOPY;  Service: Endoscopy;  Laterality: [name];  ? HERNIA REPAIR    ? ? ? ?Current Outpatient Medications  ?Medication Sig Dispense Refill  ? amLODipine (NORVASC) 5 MG tablet Take 1 tablet (5 mg total) by mouth daily. 90 tablet 0  ? aspirin EC 81 MG tablet Take 1 tablet (81 mg total) by mouth daily. 90 tablet 3  ? carvedilol (COREG) 12.5 MG tablet Take 1 tablet (12.5 mg total) by mouth 2 (two) times daily. 180 tablet 2  ? glipiZIDE (GLUCOTROL XL) 2.5 MG 24 hr tablet Take 2.5 mg by mouth daily with breakfast.    ? levocetirizine (XYZAL) 5 MG tablet Take 5 mg by mouth every evening.    ? lisinopril (ZESTRIL) 40 MG tablet TAKE 1 TABLET BY MOUTH DAILY 90 tablet 1  ? nitroGLYCERIN (NITROSTAT) 0.4 MG SL tablet Place 1 tablet (0.4 mg total) under the tongue every 5 (five) minutes as needed for chest pain. 25 tablet 3  ? rosuvastatin (CRESTOR) 20 MG tablet TAKE 1 TABLET BY MOUTH DAILY 90 tablet 1  ? metFORMIN (GLUCOPHAGE) 500 MG tablet Take 500 mg by mouth 2 (two) times daily with a meal. (Patient not taking: Reported on 09/15/2021)    ? ?No current facility-administered medications for this visit.  ? ? ?  Allergies:   Latex  ? ? ?Social History:  The patient  reports that he quit smoking about 8 years ago. His smoking use included cigarettes. He has a 30.00 pack-year smoking history. He has never used smokeless tobacco. He reports that he does not drink alcohol and does not use drugs.  ? ?Family History:  The patient's family history includes Heart disease in his father; Heart failure in his father.  ? ? ?ROS:  Please see the history of present illness.   Otherwise, review of systems are positive for none.   All other systems are reviewed and negative.  ? ? ?PHYSICAL EXAM: ?VS:  BP 120/60 (BP Location:  Left Arm, Patient Position: Sitting, Cuff Size: Normal)   Pulse 84   Ht 5' 10.5" (1.791 m)   Wt 273 lb 6.4 oz (124 kg)   SpO2 97%   BMI 38.67 kg/m?  , BMI Body mass index is 38.67 kg/m?. ?GEN: Well nourished, well developed, in no acute distress  ?HEENT: normal  ?Neck: no JVD, carotid bruits, or masses ?Cardiac: RRR; no rubs, or gallops,no edema .  2 /6 systolic murmur in the aortic area ?Respiratory:  clear to auscultation bilaterally, normal work of breathing ?GI: soft, nontender, nondistended, + BS ?MS: no deformity or atrophy  ?Skin: warm and dry, no rash ?Neuro:  Strength and sensation are intact ?Psych: euthymic mood, full affect ? ? ?EKG:  EKG is ordered today. ?The ekg ordered today demonstrates sinus rhythm with left axis deviation. ? ? ?Recent Labs: ?No results found for requested labs within last 8760 hours.  ? ? ?Lipid Panel ?   ?Component Value Date/Time  ? CHOL 107 01/26/2016 0739  ? CHOL 212 (H) 12/19/2012 0601  ? TRIG 105 01/26/2016 0739  ? TRIG 142 12/19/2012 0601  ? HDL 40 01/26/2016 0739  ? HDL 31 (L) 12/19/2012 0601  ? CHOLHDL 2.7 01/26/2016 0739  ? VLDL 28 12/19/2012 0601  ? LDLCALC 46 01/26/2016 0739  ? LDLCALC 153 (H) 12/19/2012 0601  ? ?  ? ?Wt Readings from Last 3 Encounters:  ?09/15/21 273 lb 6.4 oz (124 kg)  ?03/10/21 270 lb 4 oz (122.6 kg)  ?09/04/20 265 lb (120.2 kg)  ?  ? ? ? ? ?No flowsheet data found. ? ? ? ?ASSESSMENT AND PLAN: ? ? ?1.  Coronary artery disease involving native coronary arteries without angina:He is overall doing reasonably well with no anginal symptoms.  Continue medical therapy. ? ?2. Essential hypertension: Blood pressure is well controlled on current medications. ? ?3. Hyperlipidemia: Continue rosuvastatin with a target LDL of less than 70.  He gets this checked with his primary care physician and his LDL in the past has been consistently below 70. ? ?4.  Obesity: I again discussed with him the importance of healthy lifestyle changes and regular  exercise. ? ?5.  Cardiac murmur: Likely due to aortic sclerosis.  Echocardiogram last year showed no significant stenosis. ? ? ?Disposition:   FU with me in 12 months ? ?Signed, ? ?Alan Sacramento, MD  ?09/15/2021 2:31 PM    ?[place] ?

## 2021-09-15 NOTE — Patient Instructions (Signed)

## 2021-09-16 DIAGNOSIS — H6122 Impacted cerumen, left ear: Secondary | ICD-10-CM | POA: Diagnosis not present

## 2021-10-02 ENCOUNTER — Other Ambulatory Visit: Payer: Self-pay | Admitting: Cardiovascular Disease

## 2021-10-21 ENCOUNTER — Other Ambulatory Visit: Payer: Self-pay | Admitting: Cardiovascular Disease

## 2021-12-07 ENCOUNTER — Other Ambulatory Visit: Payer: Self-pay | Admitting: Cardiovascular Disease

## 2021-12-22 DIAGNOSIS — E119 Type 2 diabetes mellitus without complications: Secondary | ICD-10-CM | POA: Diagnosis not present

## 2021-12-22 DIAGNOSIS — I10 Essential (primary) hypertension: Secondary | ICD-10-CM | POA: Diagnosis not present

## 2021-12-22 DIAGNOSIS — E785 Hyperlipidemia, unspecified: Secondary | ICD-10-CM | POA: Diagnosis not present

## 2021-12-22 DIAGNOSIS — E663 Overweight: Secondary | ICD-10-CM | POA: Diagnosis not present

## 2021-12-22 DIAGNOSIS — R7303 Prediabetes: Secondary | ICD-10-CM | POA: Diagnosis not present

## 2021-12-22 DIAGNOSIS — R7301 Impaired fasting glucose: Secondary | ICD-10-CM | POA: Diagnosis not present

## 2021-12-25 DIAGNOSIS — R5383 Other fatigue: Secondary | ICD-10-CM | POA: Diagnosis not present

## 2021-12-25 DIAGNOSIS — E6609 Other obesity due to excess calories: Secondary | ICD-10-CM | POA: Diagnosis not present

## 2021-12-25 DIAGNOSIS — R7303 Prediabetes: Secondary | ICD-10-CM | POA: Diagnosis not present

## 2021-12-25 DIAGNOSIS — E785 Hyperlipidemia, unspecified: Secondary | ICD-10-CM | POA: Diagnosis not present

## 2021-12-25 DIAGNOSIS — I10 Essential (primary) hypertension: Secondary | ICD-10-CM | POA: Diagnosis not present

## 2021-12-25 DIAGNOSIS — R339 Retention of urine, unspecified: Secondary | ICD-10-CM | POA: Diagnosis not present

## 2022-02-03 DIAGNOSIS — H2513 Age-related nuclear cataract, bilateral: Secondary | ICD-10-CM | POA: Diagnosis not present

## 2022-02-03 DIAGNOSIS — Z0100 Encounter for examination of eyes and vision without abnormal findings: Secondary | ICD-10-CM | POA: Diagnosis not present

## 2022-02-11 ENCOUNTER — Other Ambulatory Visit: Payer: Self-pay | Admitting: Cardiovascular Disease

## 2022-03-04 DIAGNOSIS — H2513 Age-related nuclear cataract, bilateral: Secondary | ICD-10-CM | POA: Diagnosis not present

## 2022-03-04 DIAGNOSIS — H40053 Ocular hypertension, bilateral: Secondary | ICD-10-CM | POA: Diagnosis not present

## 2022-04-05 DIAGNOSIS — H2512 Age-related nuclear cataract, left eye: Secondary | ICD-10-CM | POA: Diagnosis not present

## 2022-04-06 DIAGNOSIS — E785 Hyperlipidemia, unspecified: Secondary | ICD-10-CM | POA: Diagnosis not present

## 2022-04-06 DIAGNOSIS — R7303 Prediabetes: Secondary | ICD-10-CM | POA: Diagnosis not present

## 2022-04-06 DIAGNOSIS — R5383 Other fatigue: Secondary | ICD-10-CM | POA: Diagnosis not present

## 2022-04-06 DIAGNOSIS — R339 Retention of urine, unspecified: Secondary | ICD-10-CM | POA: Diagnosis not present

## 2022-04-06 DIAGNOSIS — I10 Essential (primary) hypertension: Secondary | ICD-10-CM | POA: Diagnosis not present

## 2022-04-07 ENCOUNTER — Encounter: Payer: Self-pay | Admitting: Ophthalmology

## 2022-04-09 DIAGNOSIS — E785 Hyperlipidemia, unspecified: Secondary | ICD-10-CM | POA: Diagnosis not present

## 2022-04-09 DIAGNOSIS — Z0001 Encounter for general adult medical examination with abnormal findings: Secondary | ICD-10-CM | POA: Diagnosis not present

## 2022-04-09 DIAGNOSIS — R7303 Prediabetes: Secondary | ICD-10-CM | POA: Diagnosis not present

## 2022-04-09 DIAGNOSIS — E875 Hyperkalemia: Secondary | ICD-10-CM | POA: Diagnosis not present

## 2022-04-09 DIAGNOSIS — I2510 Atherosclerotic heart disease of native coronary artery without angina pectoris: Secondary | ICD-10-CM | POA: Diagnosis not present

## 2022-04-09 DIAGNOSIS — I10 Essential (primary) hypertension: Secondary | ICD-10-CM | POA: Diagnosis not present

## 2022-04-12 NOTE — Discharge Instructions (Signed)

## 2022-04-13 ENCOUNTER — Telehealth: Payer: Self-pay | Admitting: Urgent Care

## 2022-04-13 NOTE — Progress Notes (Addendum)
  Perioperative Services Pre-Admission/Anesthesia Testing    Date: 04/13/22  Name: [name] MRN:   [id number]  Re: Presurgical labs  Patient is scheduled for cataract surgery at [facility] on 04/14/2022.  During his preadmission review, it was noted that patient was seen yesterday (04/12/2022) and treated with "3 doses of Lokelma" following review of most recent labs. Of note, patient is seen by an external provider not in the [facility] system. Additionally, provider is not on Epic, therefore results not readily available for review. RN asking if it is acceptable for patient to remain on the surgical schedule as planned for procedure tomorrow.   In efforts to ensure patient safety, I reached out to patient's PCP office to obtain labs for review. Following labs were obtained on 04/06/2022:  WBC 5.0 Hgb 15.6 Hct 45.5 Platelets 228  BUN 18 Creatinine 1.14 eGFR 70 Na 138 K 5.4 (lab repeated on 04/09/2022 and remained unchanged) Cl 98 Ca 10  Labs reviewed as normal overall. K+ slightly elevated when based on reference range utilized by PCP (3.5-5.2 mmol/L). Again, he was treated with Lokelma. Procedure is planned to be done using topical anesthesia. Patient is not being intubated for the procedure, thus no depolarizing induction agents are scheduled to be utilized. Patient may receive fentanyl + midazolam, neither of which should cause immediate concerns with the patient's current potassium level.   Impression and Plan:  [name] is scheduled for cataract surgery tomorrow. Labs have been reviewed by myself in preparation for his procedure. Labs to be reviewed by anesthesia provider on site prior to surgery to ensure the provision of safe and effective anesthetic care.   With the [facility] being given by the PCP, we could potentially consider rechecking K+ if there is concern from anesthesiologist. However, based on my review, there are no significant lab derangements  noted that would preclude this patient from proceeding. Surgical center RN updated.   Honor Loh, MSN, APRN, FNP-C, CEN [facility]  Peri-operative Services Nurse Practitioner Phone: [phone] 04/13/22 11:13 AM  NOTE: This note has been prepared using Dragon dictation software. Despite my best ability to proofread, there is always the potential that unintentional transcriptional errors may still occur from this process.

## 2022-04-13 NOTE — Anesthesia Preprocedure Evaluation (Signed)
Anesthesia Evaluation  Patient identified by MRN, date of birth, ID band Patient awake    Reviewed: Allergy & Precautions, NPO status , Patient's Chart, lab work & pertinent test results  Airway Mallampati: III  TM Distance: >3 FB Neck ROM: full    Dental  (+) Upper Dentures   Pulmonary sleep apnea , former smoker,    Pulmonary exam normal        Cardiovascular hypertension, + CAD, + Past MI, + Cardiac Stents and + Peripheral Vascular Disease  Normal cardiovascular exam     Neuro/Psych negative neurological ROS  negative psych ROS   GI/Hepatic negative GI ROS, Neg liver ROS,   Endo/Other  negative endocrine ROS  Renal/GU      Musculoskeletal   Abdominal (+) + obese,   Peds  Hematology negative hematology ROS (+)   Anesthesia Other Findings Past Medical History: No date: Coronary artery disease     Comment:  a. 12/2012 Acute inferior STEMI/PCI: occluded proximal               RCA with mild LAD/left circumflex disease. Successful               thrombectomy and drug-eluting stent placement to the               proximal RCA. EF nl; b. 10/2013 MV: no ischemia/infarct. No date: Essential hypertension No date: Heart murmur No date: Hyperlipidemia No date: Morbid obesity ([place]) No date: Motion sickness     Comment:  sea sick 12/2012: Myocardial infarction [facility]) No date: Near syncope     Comment:  a. ER visit 01/2015 - ? etiology. No date: Nonadherence to medication No date: Sleep apnea     Comment:  No CPAP No date: Stomach ulcer 2017: Vasculitis ([place])     Comment:  Treated with prednisone. No date: Wears dentures     Comment:  Has full upper and lower.  Does not wear lower  Past Surgical History: No date: APPENDECTOMY 6/14: CARDIAC CATHETERIZATION     Comment:  [facility];EF 65% 08/18/2016: COLONOSCOPY WITH PROPOFOL; [name]     Comment:  Procedure: COLONOSCOPY WITH PROPOFOL;  Surgeon: Manya Silvas, MD;   Location: [facility] ENDOSCOPY;  Service:               Endoscopy;  Laterality: [name]; No date: HERNIA REPAIR  BMI    Body Mass Index: 38.45 kg/m      Reproductive/Obstetrics negative OB ROS                            Anesthesia Physical Anesthesia Plan  ASA: 3  Anesthesia Plan: MAC   Post-op Pain Management:    Induction:   PONV Risk Score and Plan:   Airway Management Planned: Natural Airway  Additional Equipment:   Intra-op Plan:   Post-operative Plan:   Informed Consent: I have reviewed the patients History and Physical, chart, labs and discussed the procedure including the risks, benefits and alternatives for the proposed anesthesia with the patient or authorized representative who has indicated his/her understanding and acceptance.     Dental advisory given  Plan Discussed with: Anesthesiologist, CRNA and Surgeon  Anesthesia Plan Comments:        Anesthesia Quick Evaluation

## 2022-04-14 ENCOUNTER — Other Ambulatory Visit: Payer: Self-pay

## 2022-04-14 ENCOUNTER — Ambulatory Visit: Payer: Medicare HMO | Admitting: Anesthesiology

## 2022-04-14 ENCOUNTER — Encounter
Admission: RE | Disposition: A | Discharge status: Home / Self Care | Payer: Self-pay | Source: Ambulatory Visit | Attending: Ophthalmology

## 2022-04-14 ENCOUNTER — Ambulatory Visit
Admission: RE | Admit: 2022-04-14 | Discharge: 2022-04-14 | Disposition: A | Discharge status: Home / Self Care | Payer: Medicare HMO | Source: Ambulatory Visit | Attending: Ophthalmology | Admitting: Ophthalmology

## 2022-04-14 ENCOUNTER — Encounter: Payer: Self-pay | Admitting: Ophthalmology

## 2022-04-14 DIAGNOSIS — I739 Peripheral vascular disease, unspecified: Secondary | ICD-10-CM | POA: Diagnosis not present

## 2022-04-14 DIAGNOSIS — Z7984 Long term (current) use of oral hypoglycemic drugs: Secondary | ICD-10-CM | POA: Insufficient documentation

## 2022-04-14 DIAGNOSIS — Z6837 Body mass index (BMI) 37.0-37.9, adult: Secondary | ICD-10-CM | POA: Diagnosis not present

## 2022-04-14 DIAGNOSIS — G4730 Sleep apnea, unspecified: Secondary | ICD-10-CM | POA: Insufficient documentation

## 2022-04-14 DIAGNOSIS — E785 Hyperlipidemia, unspecified: Secondary | ICD-10-CM | POA: Diagnosis not present

## 2022-04-14 DIAGNOSIS — I2510 Atherosclerotic heart disease of native coronary artery without angina pectoris: Secondary | ICD-10-CM | POA: Diagnosis not present

## 2022-04-14 DIAGNOSIS — I252 Old myocardial infarction: Secondary | ICD-10-CM | POA: Diagnosis not present

## 2022-04-14 DIAGNOSIS — H2512 Age-related nuclear cataract, left eye: Secondary | ICD-10-CM | POA: Diagnosis not present

## 2022-04-14 DIAGNOSIS — Z87891 Personal history of nicotine dependence: Secondary | ICD-10-CM | POA: Diagnosis not present

## 2022-04-14 DIAGNOSIS — I10 Essential (primary) hypertension: Secondary | ICD-10-CM | POA: Diagnosis not present

## 2022-04-14 DIAGNOSIS — Z955 Presence of coronary angioplasty implant and graft: Secondary | ICD-10-CM | POA: Diagnosis not present

## 2022-04-14 HISTORY — DX: Presence of dental prosthetic device (complete) (partial): Z97.2

## 2022-04-14 HISTORY — DX: Cardiac murmur, unspecified: R01.1

## 2022-04-14 HISTORY — DX: Sleep apnea, unspecified: G47.30

## 2022-04-14 HISTORY — PX: CATARACT EXTRACTION W/PHACO: SHX586

## 2022-04-14 HISTORY — DX: Motion sickness, initial encounter: T75.3XXA

## 2022-04-14 LAB — GLUCOSE, CAPILLARY
Glucose-Capillary: 101 mg/dL — ABNORMAL HIGH (ref 70–99)
Glucose-Capillary: 107 mg/dL — ABNORMAL HIGH (ref 70–99)

## 2022-04-14 SURGERY — PHACOEMULSIFICATION, CATARACT, WITH IOL INSERTION
Anesthesia: Monitor Anesthesia Care | Site: Eye | Laterality: Left

## 2022-04-14 MED ORDER — MIDAZOLAM HCL 2 MG/2ML IJ SOLN
INTRAMUSCULAR | Status: DC | PRN
  Administered 2022-04-14: 1 mg via INTRAVENOUS

## 2022-04-14 MED ORDER — ARMC OPHTHALMIC DILATING DROPS
1.0000 | OPHTHALMIC | Status: DC | PRN
  Administered 2022-04-14 (×3): 1 via OPHTHALMIC

## 2022-04-14 MED ORDER — CEFUROXIME OPHTHALMIC INJECTION 1 MG/0.1 ML
INJECTION | OPHTHALMIC | Status: DC | PRN
  Administered 2022-04-14: 1 mg via OPHTHALMIC

## 2022-04-14 MED ORDER — SIGHTPATH DOSE#1 NA HYALUR & NA CHOND-NA HYALUR IO KIT
PACK | INTRAOCULAR | Status: DC | PRN
  Administered 2022-04-14: 1 via OPHTHALMIC

## 2022-04-14 MED ORDER — SIGHTPATH DOSE#1 BSS IO SOLN
INTRAOCULAR | Status: DC | PRN
  Administered 2022-04-14: 92 mL via OPHTHALMIC

## 2022-04-14 MED ORDER — SIGHTPATH DOSE#1 BSS IO SOLN
INTRAOCULAR | Status: DC | PRN
  Administered 2022-04-14: 15 mL

## 2022-04-14 MED ORDER — TETRACAINE HCL 0.5 % OP SOLN
1.0000 [drp] | OPHTHALMIC | Status: DC | PRN
  Administered 2022-04-14 (×3): 1 [drp] via OPHTHALMIC

## 2022-04-14 MED ORDER — SIGHTPATH DOSE#1 BSS IO SOLN
INTRAOCULAR | Status: DC | PRN
  Administered 2022-04-14: 1 mL via INTRAMUSCULAR

## 2022-04-14 MED ORDER — LACTATED RINGERS IV SOLN: INTRAVENOUS | Status: DC

## 2022-04-14 MED ORDER — FENTANYL CITRATE (PF) 100 MCG/2ML IJ SOLN
INTRAMUSCULAR | Status: DC | PRN
  Administered 2022-04-14 (×2): 25 ug via INTRAVENOUS
  Administered 2022-04-14: 50 ug via INTRAVENOUS

## 2022-04-14 MED ORDER — BRIMONIDINE TARTRATE-TIMOLOL 0.2-0.5 % OP SOLN
OPHTHALMIC | Status: DC | PRN
  Administered 2022-04-14: 1 [drp] via OPHTHALMIC

## 2022-04-14 SURGICAL SUPPLY — 10 items
CATARACT SUITE SIGHTPATH (MISCELLANEOUS) ×1 IMPLANT
FEE CATARACT SUITE SIGHTPATH (MISCELLANEOUS) ×1 IMPLANT
GLOVE SRG 8 PF TXTR STRL LF DI (GLOVE) ×1 IMPLANT
GLOVE SURG GAMMEX PI TX LF 7.5 (GLOVE) IMPLANT
GLOVE SURG UNDER POLY LF SZ8 (GLOVE) ×1
LENS IOL TECNIS EYHANCE 17.5 (Intraocular Lens) IMPLANT
NDL FILTER BLUNT 18X1 1/2 (NEEDLE) ×1 IMPLANT
NEEDLE FILTER BLUNT 18X1 1/2 (NEEDLE) ×1 IMPLANT
SYR 3ML LL SCALE MARK (SYRINGE) ×1 IMPLANT
WATER STERILE IRR 250ML POUR (IV SOLUTION) ×1 IMPLANT

## 2022-04-14 NOTE — Op Note (Signed)
OPERATIVE NOTE  GAREK SCHUNEMAN [id number] 04/14/2022   PREOPERATIVE DIAGNOSIS:  Nuclear sclerotic cataract left eye. H25.12   POSTOPERATIVE DIAGNOSIS:    Nuclear sclerotic cataract left eye.     PROCEDURE:  Phacoemusification with posterior chamber intraocular lens placement of the left eye  Ultrasound time: Procedure(s) with comments: CATARACT EXTRACTION PHACO AND INTRAOCULAR LENS PLACEMENT (IOC) LEFT DIABETIC (Left) - sleep apnea 14.48 01:42.0  LENS:   Implant Name Type Inv. Item Serial No. Manufacturer Lot No. LRB No. Used Action  LENS IOL TECNIS EYHANCE 17.5 - [id number] Intraocular Lens LENS IOL TECNIS EYHANCE 17.5 [id number] [employer]  Left 1 Implanted      SURGEON:  Wyonia Hough, MD   ANESTHESIA:  Topical with tetracaine drops and 2% Xylocaine jelly, augmented with 1% preservative-free intracameral lidocaine.    COMPLICATIONS:  None.   DESCRIPTION OF PROCEDURE:  The patient was identified in the holding room and transported to the operating room and placed in the supine position under the operating microscope.  The left eye was identified as the operative eye and it was prepped and draped in the usual sterile ophthalmic fashion.   A 1 millimeter clear-corneal paracentesis was made at the 1:30 position.  0.5 ml of preservative-free 1% lidocaine was injected into the anterior chamber.  The anterior chamber was filled with Viscoat viscoelastic.  A 2.4 millimeter keratome was used to make a near-clear corneal incision at the 10:30 position.  .  A curvilinear capsulorrhexis was made with a cystotome and capsulorrhexis forceps.  Balanced salt solution was used to hydrodissect and hydrodelineate the nucleus.   Phacoemulsification was then used in stop and chop fashion to remove the lens nucleus and epinucleus.  The remaining cortex was then removed using the irrigation and aspiration handpiece. Provisc was then placed into the capsular bag to distend it for lens placement.   A lens was then injected into the capsular bag.  The remaining viscoelastic was aspirated.   Wounds were hydrated with balanced salt solution.  The anterior chamber was inflated to a physiologic pressure with balanced salt solution.  No wound leaks were noted. Cefuroxime 0.1 ml of a '10mg'$ /ml solution was injected into the anterior chamber for a dose of 1 mg of intracameral antibiotic at the completion of the case.   Timolol and Brimonidine drops were applied to the eye.  The patient was taken to the recovery room in stable condition without complications of anesthesia or surgery.  Soni Kegel 04/14/2022, 8:01 AM

## 2022-04-14 NOTE — Anesthesia Postprocedure Evaluation (Signed)
Anesthesia Post Note  Patient: [name]  Procedure(s) Performed: CATARACT EXTRACTION PHACO AND INTRAOCULAR LENS PLACEMENT (IOC) LEFT DIABETIC (Left: Eye)  Patient location during evaluation: PACU Anesthesia Type: MAC Level of consciousness: awake and alert Pain management: pain level controlled Vital Signs Assessment: post-procedure vital signs reviewed and stable Respiratory status: spontaneous breathing, nonlabored ventilation and respiratory function stable Cardiovascular status: stable and blood pressure returned to baseline Postop Assessment: no apparent nausea or vomiting Anesthetic complications: no   There were no known notable events for this encounter.   Last Vitals:  Vitals:   04/14/22 0806 04/14/22 0815  BP: 134/65 (!) 142/84  Pulse: 61 65  Resp: 20 (!) 22  Temp: 36.8 C   SpO2: 96% 97%    Last Pain:  Vitals:   04/14/22 0815  TempSrc:   PainSc: 0-No pain                 Iran Ouch

## 2022-04-14 NOTE — Transfer of Care (Signed)
Immediate Anesthesia Transfer of Care Note  Patient: [name]  Procedure(s) Performed: CATARACT EXTRACTION PHACO AND INTRAOCULAR LENS PLACEMENT (IOC) LEFT DIABETIC (Left: Eye)  Patient Location: PACU  Anesthesia Type: MAC  Level of Consciousness: awake, alert  and patient cooperative  Airway and Oxygen Therapy: Patient Spontanous Breathing and Patient connected to supplemental oxygen  Post-op Assessment: Post-op Vital signs reviewed, Patient's Cardiovascular Status Stable, Respiratory Function Stable, Patent Airway and No signs of Nausea or vomiting  Post-op Vital Signs: Reviewed and stable  Complications: There were no known notable events for this encounter.

## 2022-04-14 NOTE — H&P (Signed)
[facility]   Primary Care Physician:  Danelle Berry, NP Ophthalmologist: Dr. Leandrew Koyanagi  Pre-Procedure History & Physical: HPI:  [name] is a 66 y.o. male here for ophthalmic surgery.   Past Medical History:  Diagnosis Date   Coronary artery disease    a. 12/2012 Acute inferior STEMI/PCI: occluded proximal RCA with mild LAD/left circumflex disease. Successful thrombectomy and drug-eluting stent placement to the proximal RCA. EF nl; b. 10/2013 MV: no ischemia/infarct.   Essential hypertension    Heart murmur    Hyperlipidemia    Morbid obesity ([place])    Motion sickness    sea sick   Myocardial infarction [facility]) 12/2012   Near syncope    a. ER visit 01/2015 - ? etiology.   Nonadherence to medication    Sleep apnea    No CPAP   Stomach ulcer    Vasculitis ([place]) 2017   Treated with prednisone.   Wears dentures    Has full upper and lower.  Does not wear lower    Past Surgical History:  Procedure Laterality Date   APPENDECTOMY     CARDIAC CATHETERIZATION  6/14   [facility];EF 65%   COLONOSCOPY WITH PROPOFOL [name] 08/18/2016   Procedure: COLONOSCOPY WITH PROPOFOL;  Surgeon: Manya Silvas, MD;  Location: [facility] ENDOSCOPY;  Service: Endoscopy;  Laterality: [name];   HERNIA REPAIR      Prior to Admission medications   Medication Sig Start Date End Date Taking? Authorizing Provider  amLODipine (NORVASC) 5 MG tablet TAKE 1 TABLET BY MOUTH DAILY 12/07/21  Yes Wellington Hampshire, MD  carvedilol (COREG) 12.5 MG tablet TAKE 1 TABLET BY MOUTH TWICE A DAY 02/11/22  Yes Arida, Muhammad A, MD  glipiZIDE (GLUCOTROL XL) 2.5 MG 24 hr tablet Take 2.5 mg by mouth daily with breakfast.   Yes [provider]  lisinopril (ZESTRIL) 40 MG tablet TAKE 1 TABLET BY MOUTH DAILY 10/21/21  Yes Wellington Hampshire, MD  rosuvastatin (CRESTOR) 20 MG tablet TAKE 1 TABLET BY MOUTH DAILY 10/02/21  Yes Wellington Hampshire, MD  vitamin D3 (CHOLECALCIFEROL) 25 MCG tablet Take 1,000 Units by mouth  daily.   Yes [provider]  metFORMIN (GLUCOPHAGE) 500 MG tablet Take 500 mg by mouth 2 (two) times daily with a meal. Patient not taking: Reported on 09/15/2021    [provider]  nitroGLYCERIN (NITROSTAT) 0.4 MG SL tablet Place 1 tablet (0.4 mg total) under the tongue every 5 (five) minutes as needed for chest pain. 08/10/18   Theora Gianotti, NP    Allergies as of 03/05/2022 - Review Complete 09/15/2021  Allergen Reaction Noted   Latex Rash 03/16/2016    Family History  Problem Relation Age of Onset   Heart failure Father    Heart disease Father     Social History   Socioeconomic History   Marital status: Divorced    Spouse name: Not on file   Number of children: Not on file   Years of education: Not on file   Highest education level: Not on file  Occupational History   Not on file  Tobacco Use   Smoking status: Former    Packs/day: 1.00    Years: 30.00    Total pack years: 30.00    Types: Cigarettes    Quit date: 12/18/2012    Years since quitting: 9.3   Smokeless tobacco: Never  Vaping Use   Vaping Use: Never used  Substance and Sexual Activity  Alcohol use: No    Comment: history of ETOH last drink 1998   Drug use: No    Comment: past marijuana   Sexual activity: Not on file  Other Topics Concern   Not on file  Social History Narrative   Not on file   Social Determinants of Health   Financial Resource Strain: Not on file  Food Insecurity: Not on file  Transportation Needs: Not on file  Physical Activity: Not on file  Stress: Not on file  Social Connections: Not on file  Intimate Partner Violence: Not on file    Review of Systems: See HPI, otherwise negative ROS  Physical Exam: BP (!) 163/71   Pulse 68   Temp 98.1 F (36.7 C) (Temporal)   Resp 16   Ht '5\' 10"'$  (1.778 m)   Wt 117.9 kg   SpO2 95%   BMI 37.31 kg/m  General:   Alert,  pleasant and cooperative in NAD Head:  Normocephalic and atraumatic. Lungs:   Clear to auscultation.    Heart:  Regular rate and rhythm.   Impression/Plan: [name] is here for ophthalmic surgery.  Risks, benefits, limitations, and alternatives regarding ophthalmic surgery have been reviewed with the patient.  Questions have been answered.  All parties agreeable.   Leandrew Koyanagi, MD  04/14/2022, 7:34 AM

## 2022-04-15 ENCOUNTER — Encounter: Payer: Self-pay | Admitting: Ophthalmology

## 2022-04-20 DIAGNOSIS — H2511 Age-related nuclear cataract, right eye: Secondary | ICD-10-CM | POA: Diagnosis not present

## 2022-04-26 ENCOUNTER — Other Ambulatory Visit: Payer: Self-pay | Admitting: Cardiovascular Disease

## 2022-04-27 NOTE — Discharge Instructions (Signed)

## 2022-04-28 ENCOUNTER — Ambulatory Visit
Admission: RE | Admit: 2022-04-28 | Discharge: 2022-04-28 | Disposition: A | Discharge status: Home / Self Care | Payer: Medicare HMO | Source: Ambulatory Visit | Attending: Ophthalmology | Admitting: Ophthalmology

## 2022-04-28 ENCOUNTER — Ambulatory Visit: Payer: Medicare HMO | Admitting: Anesthesiology

## 2022-04-28 ENCOUNTER — Encounter
Admission: RE | Disposition: A | Discharge status: Home / Self Care | Payer: Self-pay | Source: Ambulatory Visit | Attending: Ophthalmology

## 2022-04-28 ENCOUNTER — Encounter: Payer: Self-pay | Admitting: Ophthalmology

## 2022-04-28 ENCOUNTER — Other Ambulatory Visit: Payer: Self-pay

## 2022-04-28 DIAGNOSIS — G4730 Sleep apnea, unspecified: Secondary | ICD-10-CM | POA: Diagnosis not present

## 2022-04-28 DIAGNOSIS — I252 Old myocardial infarction: Secondary | ICD-10-CM | POA: Diagnosis not present

## 2022-04-28 DIAGNOSIS — H2511 Age-related nuclear cataract, right eye: Secondary | ICD-10-CM | POA: Diagnosis not present

## 2022-04-28 DIAGNOSIS — E785 Hyperlipidemia, unspecified: Secondary | ICD-10-CM | POA: Insufficient documentation

## 2022-04-28 DIAGNOSIS — Z87891 Personal history of nicotine dependence: Secondary | ICD-10-CM | POA: Insufficient documentation

## 2022-04-28 DIAGNOSIS — I10 Essential (primary) hypertension: Secondary | ICD-10-CM | POA: Diagnosis not present

## 2022-04-28 DIAGNOSIS — H25042 Posterior subcapsular polar age-related cataract, left eye: Secondary | ICD-10-CM | POA: Diagnosis not present

## 2022-04-28 DIAGNOSIS — I2510 Atherosclerotic heart disease of native coronary artery without angina pectoris: Secondary | ICD-10-CM | POA: Insufficient documentation

## 2022-04-28 DIAGNOSIS — H2512 Age-related nuclear cataract, left eye: Secondary | ICD-10-CM | POA: Diagnosis not present

## 2022-04-28 DIAGNOSIS — Z6838 Body mass index (BMI) 38.0-38.9, adult: Secondary | ICD-10-CM | POA: Insufficient documentation

## 2022-04-28 HISTORY — PX: CATARACT EXTRACTION W/PHACO: SHX586

## 2022-04-28 LAB — GLUCOSE, CAPILLARY
Glucose-Capillary: 101 mg/dL — ABNORMAL HIGH (ref 70–99)
Glucose-Capillary: 112 mg/dL — ABNORMAL HIGH (ref 70–99)

## 2022-04-28 SURGERY — PHACOEMULSIFICATION, CATARACT, WITH IOL INSERTION
Anesthesia: Monitor Anesthesia Care | Site: Eye | Laterality: Right

## 2022-04-28 MED ORDER — SIGHTPATH DOSE#1 BSS IO SOLN
INTRAOCULAR | Status: DC | PRN
  Administered 2022-04-28: 58 mL via OPHTHALMIC

## 2022-04-28 MED ORDER — ARMC OPHTHALMIC DILATING DROPS
1.0000 | OPHTHALMIC | Status: DC | PRN
  Administered 2022-04-28 (×3): 1 via OPHTHALMIC

## 2022-04-28 MED ORDER — CEFUROXIME OPHTHALMIC INJECTION 1 MG/0.1 ML
INJECTION | OPHTHALMIC | Status: DC | PRN
  Administered 2022-04-28: 0.1 mL via INTRACAMERAL

## 2022-04-28 MED ORDER — SIGHTPATH DOSE#1 BSS IO SOLN
INTRAOCULAR | Status: DC | PRN
  Administered 2022-04-28: 15 mL

## 2022-04-28 MED ORDER — TETRACAINE HCL 0.5 % OP SOLN
1.0000 [drp] | OPHTHALMIC | Status: DC | PRN
  Administered 2022-04-28 (×3): 1 [drp] via OPHTHALMIC

## 2022-04-28 MED ORDER — FENTANYL CITRATE (PF) 100 MCG/2ML IJ SOLN
INTRAMUSCULAR | Status: DC | PRN
  Administered 2022-04-28 (×2): 50 ug via INTRAVENOUS

## 2022-04-28 MED ORDER — SIGHTPATH DOSE#1 NA HYALUR & NA CHOND-NA HYALUR IO KIT
PACK | INTRAOCULAR | Status: DC | PRN
  Administered 2022-04-28: 1 via OPHTHALMIC

## 2022-04-28 MED ORDER — SIGHTPATH DOSE#1 BSS IO SOLN
INTRAOCULAR | Status: DC | PRN
  Administered 2022-04-28: 1 mL via INTRAMUSCULAR

## 2022-04-28 MED ORDER — MIDAZOLAM HCL 2 MG/2ML IJ SOLN
INTRAMUSCULAR | Status: DC | PRN
  Administered 2022-04-28 (×2): 1 mg via INTRAVENOUS

## 2022-04-28 MED ORDER — BRIMONIDINE TARTRATE-TIMOLOL 0.2-0.5 % OP SOLN
OPHTHALMIC | Status: DC | PRN
  Administered 2022-04-28: 1 [drp] via OPHTHALMIC

## 2022-04-28 SURGICAL SUPPLY — 10 items
CATARACT SUITE SIGHTPATH (MISCELLANEOUS) ×1 IMPLANT
FEE CATARACT SUITE SIGHTPATH (MISCELLANEOUS) ×1 IMPLANT
GLOVE SRG 8 PF TXTR STRL LF DI (GLOVE) ×1 IMPLANT
GLOVE SURG ENC TEXT LTX SZ7.5 (GLOVE) ×1 IMPLANT
GLOVE SURG UNDER POLY LF SZ8 (GLOVE) ×1
LENS IOL TECNIS EYHANCE 18.5 (Intraocular Lens) IMPLANT
NDL FILTER BLUNT 18X1 1/2 (NEEDLE) ×1 IMPLANT
NEEDLE FILTER BLUNT 18X1 1/2 (NEEDLE) ×1 IMPLANT
SYR 3ML LL SCALE MARK (SYRINGE) ×1 IMPLANT
WATER STERILE IRR 250ML POUR (IV SOLUTION) ×1 IMPLANT

## 2022-04-28 NOTE — Anesthesia Postprocedure Evaluation (Signed)
Anesthesia Post Note  Patient: [name]  Procedure(s) Performed: CATARACT EXTRACTION PHACO AND INTRAOCULAR LENS PLACEMENT (IOC) RIGHT  DIABETIC 10.61 01:04.2 (Right: Eye)  Patient location during evaluation: PACU Anesthesia Type: MAC Level of consciousness: awake and alert Pain management: pain level controlled Vital Signs Assessment: post-procedure vital signs reviewed and stable Respiratory status: spontaneous breathing, nonlabored ventilation, respiratory function stable and patient connected to nasal cannula oxygen Cardiovascular status: stable and blood pressure returned to baseline Postop Assessment: no apparent nausea or vomiting Anesthetic complications: no   No notable events documented.   Last Vitals:  Vitals:   04/28/22 0755 04/28/22 0800  BP: 138/64 131/68  Pulse: 65 62  Resp: 16 13  Temp: 36.6 C 36.6 C  SpO2: 97% 94%    Last Pain:  Vitals:   04/28/22 0755  TempSrc:   PainSc: 3                  Martha Clan

## 2022-04-28 NOTE — H&P (Signed)
[facility]   Primary Care Physician:  Danelle Berry, NP Ophthalmologist: Dr. Leandrew Koyanagi  Pre-Procedure History & Physical: HPI:  [name] is a 66 y.o. male here for ophthalmic surgery.   Past Medical History:  Diagnosis Date   Coronary artery disease    a. 12/2012 Acute inferior STEMI/PCI: occluded proximal RCA with mild LAD/left circumflex disease. Successful thrombectomy and drug-eluting stent placement to the proximal RCA. EF nl; b. 10/2013 MV: no ischemia/infarct.   Essential hypertension    Heart murmur    Hyperlipidemia    Morbid obesity ([place])    Motion sickness    sea sick   Myocardial infarction [facility]) 12/2012   Near syncope    a. ER visit 01/2015 - ? etiology.   Nonadherence to medication    Sleep apnea    No CPAP   Stomach ulcer    Vasculitis ([place]) 2017   Treated with prednisone.   Wears dentures    Has full upper and lower.  Does not wear lower    Past Surgical History:  Procedure Laterality Date   APPENDECTOMY     CARDIAC CATHETERIZATION  6/14   [facility];EF 65%   CATARACT EXTRACTION W/PHACO Left 04/14/2022   Procedure: CATARACT EXTRACTION PHACO AND INTRAOCULAR LENS PLACEMENT ([place]) LEFT DIABETIC;  Surgeon: Leandrew Koyanagi, MD;  Location: [place];  Service: Ophthalmology;  Laterality: Left;  sleep apnea 14.48 01:42.0   COLONOSCOPY WITH PROPOFOL [name] 08/18/2016   Procedure: COLONOSCOPY WITH PROPOFOL;  Surgeon: Manya Silvas, MD;  Location: [facility] ENDOSCOPY;  Service: Endoscopy;  Laterality: [name];   HERNIA REPAIR      Prior to Admission medications   Medication Sig Start Date End Date Taking? Authorizing Provider  amLODipine (NORVASC) 5 MG tablet TAKE 1 TABLET BY MOUTH DAILY 12/07/21  Yes Wellington Hampshire, MD  carvedilol (COREG) 12.5 MG tablet TAKE 1 TABLET BY MOUTH TWICE A DAY 02/11/22  Yes Arida, Muhammad A, MD  glipiZIDE (GLUCOTROL XL) 2.5 MG 24 hr tablet Take 2.5 mg by mouth daily with breakfast.   Yes [provider]  lisinopril (ZESTRIL) 40 MG tablet TAKE 1 TABLET BY MOUTH DAILY 04/26/22  Yes Wellington Hampshire, MD  rosuvastatin (CRESTOR) 20 MG tablet TAKE 1 TABLET BY MOUTH DAILY 10/02/21  Yes Wellington Hampshire, MD  vitamin D3 (CHOLECALCIFEROL) 25 MCG tablet Take 1,000 Units by mouth daily.   Yes [provider]  metFORMIN (GLUCOPHAGE) 500 MG tablet Take 500 mg by mouth 2 (two) times daily with a meal. Patient not taking: Reported on 09/15/2021    [provider]  nitroGLYCERIN (NITROSTAT) 0.4 MG SL tablet Place 1 tablet (0.4 mg total) under the tongue every 5 (five) minutes as needed for chest pain. 08/10/18   Theora Gianotti, NP    Allergies as of 03/05/2022 - Review Complete 09/15/2021  Allergen Reaction Noted   Latex Rash 03/16/2016    Family History  Problem Relation Age of Onset   Heart failure Father    Heart disease Father     Social History   Socioeconomic History   Marital status: Divorced    Spouse name: Not on file   Number of children: Not on file   Years of education: Not on file   Highest education level: Not on file  Occupational History   Not on file  Tobacco Use   Smoking status: Former    Packs/day: 1.00    Years: 30.00    Total pack  years: 30.00    Types: Cigarettes    Quit date: 12/18/2012    Years since quitting: 9.3   Smokeless tobacco: Never  Vaping Use   Vaping Use: Never used  Substance and Sexual Activity   Alcohol use: No    Comment: history of ETOH last drink 1998   Drug use: No    Comment: past marijuana   Sexual activity: Not on file  Other Topics Concern   Not on file  Social History Narrative   Not on file   Social Determinants of Health   Financial Resource Strain: Not on file  Food Insecurity: Not on file  Transportation Needs: Not on file  Physical Activity: Not on file  Stress: Not on file  Social Connections: Not on file  Intimate Partner Violence: Not on file    Review of Systems: See HPI,  otherwise negative ROS  Physical Exam: BP (!) 158/69   Pulse 71   Temp 98.4 F (36.9 C) (Temporal)   Resp 17   Ht '5\' 10"'$  (1.778 m)   Wt 121.1 kg   SpO2 97%   BMI 38.31 kg/m  General:   Alert,  pleasant and cooperative in NAD Head:  Normocephalic and atraumatic. Lungs:  Clear to auscultation.    Heart:  Regular rate and rhythm.   Impression/Plan: [name] is here for ophthalmic surgery.  Risks, benefits, limitations, and alternatives regarding ophthalmic surgery have been reviewed with the patient.  Questions have been answered.  All parties agreeable.   Leandrew Koyanagi, MD  04/28/2022, 7:31 AM

## 2022-04-28 NOTE — Anesthesia Preprocedure Evaluation (Signed)
Anesthesia Evaluation  Patient identified by MRN, date of birth, ID band Patient awake    Reviewed: Allergy & Precautions, NPO status , Patient's Chart, lab work & pertinent test results  Airway Mallampati: III  TM Distance: >3 FB Neck ROM: full    Dental  (+) Upper Dentures, Dental Advidsory Given   Pulmonary neg shortness of breath, sleep apnea , neg recent URI, former smoker,    Pulmonary exam normal        Cardiovascular hypertension, (-) angina+ CAD, + Past MI, + Cardiac Stents and + Peripheral Vascular Disease  Normal cardiovascular exam     Neuro/Psych negative neurological ROS  negative psych ROS   GI/Hepatic negative GI ROS, Neg liver ROS,   Endo/Other  negative endocrine ROS  Renal/GU      Musculoskeletal   Abdominal (+) + obese,   Peds  Hematology negative hematology ROS (+)   Anesthesia Other Findings Past Medical History: No date: Coronary artery disease     Comment:  a. 12/2012 Acute inferior STEMI/PCI: occluded proximal               RCA with mild LAD/left circumflex disease. Successful               thrombectomy and drug-eluting stent placement to the               proximal RCA. EF nl; b. 10/2013 MV: no ischemia/infarct. No date: Essential hypertension No date: Heart murmur No date: Hyperlipidemia No date: Morbid obesity ([place]) No date: Motion sickness     Comment:  sea sick 12/2012: Myocardial infarction [facility]) No date: Near syncope     Comment:  a. ER visit 01/2015 - ? etiology. No date: Nonadherence to medication No date: Sleep apnea     Comment:  No CPAP No date: Stomach ulcer 2017: Vasculitis ([place])     Comment:  Treated with prednisone. No date: Wears dentures     Comment:  Has full upper and lower.  Does not wear lower  Past Surgical History: No date: APPENDECTOMY 6/14: CARDIAC CATHETERIZATION     Comment:  [facility];EF 65% 08/18/2016: COLONOSCOPY WITH PROPOFOL; [name]     Comment:   Procedure: COLONOSCOPY WITH PROPOFOL;  Surgeon: Manya Silvas, MD;  Location: [facility] ENDOSCOPY;  Service:               Endoscopy;  Laterality: [name]; No date: HERNIA REPAIR  BMI    Body Mass Index: 38.45 kg/m      Reproductive/Obstetrics negative OB ROS                             Anesthesia Physical  Anesthesia Plan  ASA: 3  Anesthesia Plan: MAC   Post-op Pain Management:    Induction:   PONV Risk Score and Plan:   Airway Management Planned: Natural Airway  Additional Equipment:   Intra-op Plan:   Post-operative Plan:   Informed Consent: I have reviewed the patients History and Physical, chart, labs and discussed the procedure including the risks, benefits and alternatives for the proposed anesthesia with the patient or authorized representative who has indicated his/her understanding and acceptance.     Dental advisory given  Plan Discussed with: Anesthesiologist, CRNA and Surgeon  Anesthesia Plan Comments:         Anesthesia Quick Evaluation

## 2022-04-28 NOTE — Transfer of Care (Signed)
Immediate Anesthesia Transfer of Care Note  Patient: [name]  Procedure(s) Performed: CATARACT EXTRACTION PHACO AND INTRAOCULAR LENS PLACEMENT (IOC) RIGHT  DIABETIC 10.61 01:04.2 (Right: Eye)  Patient Location: PACU  Anesthesia Type: MAC  Level of Consciousness: awake, alert  and patient cooperative  Airway and Oxygen Therapy: Patient Spontanous Breathing and Patient connected to supplemental oxygen  Post-op Assessment: Post-op Vital signs reviewed, Patient's Cardiovascular Status Stable, Respiratory Function Stable, Patent Airway and No signs of Nausea or vomiting  Post-op Vital Signs: Reviewed and stable  Complications: No notable events documented.

## 2022-04-28 NOTE — Op Note (Signed)
LOCATION:  [place]   PREOPERATIVE DIAGNOSIS:    Nuclear sclerotic cataract right eye. H25.11   POSTOPERATIVE DIAGNOSIS:  Nuclear sclerotic cataract right eye.     PROCEDURE:  Phacoemusification with posterior chamber intraocular lens placement of the right eye   ULTRASOUND TIME: Procedure(s) with comments: CATARACT EXTRACTION PHACO AND INTRAOCULAR LENS PLACEMENT (IOC) RIGHT  DIABETIC 10.61 01:04.2 (Right) - sleep apnea  LENS:   Implant Name Type Inv. Item Serial No. Manufacturer Lot No. LRB No. Used Action  LENS IOL TECNIS EYHANCE 18.5 - [id number] Intraocular Lens LENS IOL TECNIS EYHANCE 18.5 [id number] [employer]  Right 1 Implanted         SURGEON:  Wyonia Hough, MD   ANESTHESIA:  Topical with tetracaine drops and 2% Xylocaine jelly, augmented with 1% preservative-free intracameral lidocaine.    COMPLICATIONS:  None.   DESCRIPTION OF PROCEDURE:  The patient was identified in the holding room and transported to the operating room and placed in the supine position under the operating microscope.  The right eye was identified as the operative eye and it was prepped and draped in the usual sterile ophthalmic fashion.   A 1 millimeter clear-corneal paracentesis was made at the 12:00 position.  0.5 ml of preservative-free 1% lidocaine was injected into the anterior chamber. The anterior chamber was filled with Viscoat viscoelastic.  A 2.4 millimeter keratome was used to make a near-clear corneal incision at the 9:00 position.  A curvilinear capsulorrhexis was made with a cystotome and capsulorrhexis forceps.  Balanced salt solution was used to hydrodissect and hydrodelineate the nucleus.   Phacoemulsification was then used in stop and chop fashion to remove the lens nucleus and epinucleus.  The remaining cortex was then removed using the irrigation and aspiration handpiece. Provisc was then placed into the capsular bag to distend it for lens placement.  A lens was then  injected into the capsular bag.  The remaining viscoelastic was aspirated.   Wounds were hydrated with balanced salt solution.  The anterior chamber was inflated to a physiologic pressure with balanced salt solution.  No wound leaks were noted. Cefuroxime 0.1 ml of a '10mg'$ /ml solution was injected into the anterior chamber for a dose of 1 mg of intracameral antibiotic at the completion of the case.   Timolol and Brimonidine drops were applied to the eye.  The patient was taken to the recovery room in stable condition without complications of anesthesia or surgery.   Blaine Guiffre 04/28/2022, 7:54 AM

## 2022-04-29 ENCOUNTER — Encounter: Payer: Self-pay | Admitting: Ophthalmology

## 2022-06-08 ENCOUNTER — Other Ambulatory Visit: Payer: Self-pay | Admitting: Cardiovascular Disease

## 2022-07-19 ENCOUNTER — Other Ambulatory Visit: Payer: Self-pay | Admitting: Cardiovascular Disease

## 2022-07-19 NOTE — Telephone Encounter (Signed)
Please schedule 12 month F/U appointment for 90 day refills. Thank you! 

## 2022-07-21 NOTE — Telephone Encounter (Signed)
*  STAT* If patient is at the pharmacy, call can be transferred to refill team.   1. Which medications need to be refilled? (please list name of each medication and dose if known) Rosuvastatin  2. Which pharmacy/location (including street and city if local pharmacy) is medication to be sent to? [place], [place]  3. Do they need a 30 day or 90 day supply? 90 days and refills

## 2022-07-26 MED ORDER — ROSUVASTATIN CALCIUM 20 MG PO TABS: 20.0000 mg | ORAL_TABLET | Freq: Every day | ORAL | 0 refills | Status: DC

## 2022-07-26 NOTE — Telephone Encounter (Signed)
Requested Prescriptions   Signed Prescriptions Disp Refills   rosuvastatin (CRESTOR) 20 MG tablet 90 tablet 0    Sig: Take 1 tablet (20 mg total) by mouth daily.    Authorizing Provider: Kathlyn Sacramento A    Ordering User: Raelene Bott, Geraldine Sandberg L

## 2022-07-26 NOTE — Addendum Note (Signed)
Addended by: Raelene Bott, Nickey Kloepfer L on: 07/26/2022 11:54 AM   Modules accepted: Orders

## 2022-07-26 NOTE — Telephone Encounter (Signed)
Patient scheduled 10/21/22

## 2022-08-10 ENCOUNTER — Ambulatory Visit: Payer: Self-pay | Admitting: Nurse Practitioner

## 2022-08-12 ENCOUNTER — Other Ambulatory Visit: Payer: Self-pay | Admitting: Cardiovascular Disease

## 2022-10-04 ENCOUNTER — Other Ambulatory Visit: Payer: Self-pay | Admitting: Cardiovascular Disease

## 2022-10-15 ENCOUNTER — Encounter: Payer: Self-pay | Admitting: *Deleted

## 2022-10-15 ENCOUNTER — Telehealth: Payer: Self-pay | Admitting: Nurse Practitioner

## 2022-10-15 NOTE — Telephone Encounter (Signed)
Done in error.

## 2022-10-19 ENCOUNTER — Other Ambulatory Visit: Payer: Medicare HMO

## 2022-10-19 ENCOUNTER — Other Ambulatory Visit: Payer: Self-pay | Admitting: Nurse Practitioner

## 2022-10-19 DIAGNOSIS — I10 Essential (primary) hypertension: Secondary | ICD-10-CM | POA: Diagnosis not present

## 2022-10-19 DIAGNOSIS — E785 Hyperlipidemia, unspecified: Secondary | ICD-10-CM | POA: Diagnosis not present

## 2022-10-19 DIAGNOSIS — R7303 Prediabetes: Secondary | ICD-10-CM | POA: Diagnosis not present

## 2022-10-20 ENCOUNTER — Encounter: Payer: Self-pay | Admitting: Nurse Practitioner

## 2022-10-20 ENCOUNTER — Ambulatory Visit (INDEPENDENT_AMBULATORY_CARE_PROVIDER_SITE_OTHER): Payer: Medicare HMO | Admitting: Nurse Practitioner

## 2022-10-20 VITALS — BP 152/72 | HR 78 | Ht 70.5 in | Wt 268.0 lb

## 2022-10-20 DIAGNOSIS — E782 Mixed hyperlipidemia: Secondary | ICD-10-CM

## 2022-10-20 DIAGNOSIS — J069 Acute upper respiratory infection, unspecified: Secondary | ICD-10-CM | POA: Insufficient documentation

## 2022-10-20 DIAGNOSIS — I10 Essential (primary) hypertension: Secondary | ICD-10-CM

## 2022-10-20 DIAGNOSIS — J301 Allergic rhinitis due to pollen: Secondary | ICD-10-CM | POA: Insufficient documentation

## 2022-10-20 LAB — LIPID PANEL W/O CHOL/HDL RATIO
Cholesterol, Total: 134 mg/dL (ref 100–199)
HDL: 38 mg/dL — ABNORMAL LOW (ref >39)
LDL Chol Calc (NIH): 64 mg/dL (ref 0–99)
Triglycerides: 195 mg/dL — ABNORMAL HIGH (ref 0–149)
VLDL Cholesterol Cal: 32 mg/dL (ref 5–40)

## 2022-10-20 LAB — COMPREHENSIVE METABOLIC PANEL
ALT: 41 IU/L (ref 0–44)
AST: 27 IU/L (ref 0–40)
Albumin/Globulin Ratio: 1.3 (ref 1.2–2.2)
Albumin: 4.6 g/dL (ref 3.9–4.9)
Alkaline Phosphatase: 58 IU/L (ref 44–121)
BUN/Creatinine Ratio: 15 (ref 10–24)
BUN: 17 mg/dL (ref 8–27)
Bilirubin Total: 1.3 mg/dL — ABNORMAL HIGH (ref 0.0–1.2)
CO2: 24 mmol/L (ref 20–29)
Calcium: 10.2 mg/dL (ref 8.6–10.2)
Chloride: 101 mmol/L (ref 96–106)
Creatinine, Ser: 1.13 mg/dL (ref 0.76–1.27)
Globulin, Total: 3.6 g/dL (ref 1.5–4.5)
Glucose: 104 mg/dL — ABNORMAL HIGH (ref 70–99)
Potassium: 4.9 mmol/L (ref 3.5–5.2)
Sodium: 144 mmol/L (ref 134–144)
Total Protein: 8.2 g/dL (ref 6.0–8.5)
eGFR: 71 mL/min/{1.73_m2} (ref >59)

## 2022-10-20 LAB — TSH: TSH: 1.65 u[IU]/mL (ref 0.450–4.500)

## 2022-10-20 LAB — HGB A1C W/O EAG: Hgb A1c MFr Bld: 5.7 % — ABNORMAL HIGH (ref 4.8–5.6)

## 2022-10-20 MED ORDER — AMOXICILLIN-POT CLAVULANATE 875-125 MG PO TABS: 1.0000 | ORAL_TABLET | Freq: Two times a day (BID) | ORAL | 0 refills | Status: DC

## 2022-10-20 NOTE — Patient Instructions (Addendum)
1) Gave patient Zyrtec and Delsym samples 2) Amoxicillin 3) Follow up appt in 3 months, fasting labs prior

## 2022-10-20 NOTE — Progress Notes (Signed)
Established Patient Office Visit  Subjective:  Patient ID: [name], male    DOB: 1956/02/06  Age: 67 y.o. MRN: [id number]  Chief Complaint  Patient presents with   Acute Visit    Coughing up yellow phlegm x 1 week    Acute visit, difficulty coughing and breathing x 1 week.  Denies otalgia, nasal congestion and ST.  Productive cough with yellow color sputum.     No other concerns at this time.   Past Medical History:  Diagnosis Date   Coronary artery disease    a. 12/2012 Acute inferior STEMI/PCI: occluded proximal RCA with mild LAD/left circumflex disease. Successful thrombectomy and drug-eluting stent placement to the proximal RCA. EF nl; b. 10/2013 MV: no ischemia/infarct.   Essential hypertension    Heart murmur    Hyperlipidemia    Morbid obesity    Motion sickness    sea sick   Myocardial infarction 12/2012   Near syncope    a. ER visit 01/2015 - ? etiology.   Nonadherence to medication    Sleep apnea    No CPAP   Stomach ulcer    Vasculitis 2017   Treated with prednisone.   Wears dentures    Has full upper and lower.  Does not wear lower    Past Surgical History:  Procedure Laterality Date   APPENDECTOMY     CARDIAC CATHETERIZATION  6/14   [facility];EF 65%   CATARACT EXTRACTION W/PHACO Left 04/14/2022   Procedure: CATARACT EXTRACTION PHACO AND INTRAOCULAR LENS PLACEMENT (IOC) LEFT DIABETIC;  Surgeon: Lockie Mola, MD;  Location: [facility];  Service: Ophthalmology;  Laterality: Left;  sleep apnea 14.48 01:42.0   CATARACT EXTRACTION W/PHACO Right 04/28/2022   Procedure: CATARACT EXTRACTION PHACO AND INTRAOCULAR LENS PLACEMENT (IOC) RIGHT  DIABETIC 10.61 01:04.2;  Surgeon: Lockie Mola, MD;  Location: [facility];  Service: Ophthalmology;  Laterality: Right;  sleep apnea   COLONOSCOPY WITH PROPOFOL [name] 08/18/2016   Procedure: COLONOSCOPY WITH PROPOFOL;  Surgeon: Scot Jun, MD;  Location: [facility];  Service:  Endoscopy;  Laterality: [name];   HERNIA REPAIR      Social History   Socioeconomic History   Marital status: Divorced    Spouse name: Not on file   Number of children: Not on file   Years of education: Not on file   Highest education level: Not on file  Occupational History   Not on file  Tobacco Use   Smoking status: Former    Packs/day: 1.00    Years: 30.00    Additional pack years: 0.00    Total pack years: 30.00    Types: Cigarettes    Quit date: 12/18/2012    Years since quitting: 9.8   Smokeless tobacco: Never  Vaping Use   Vaping Use: Never used  Substance and Sexual Activity   Alcohol use: No    Comment: history of ETOH last drink 1998   Drug use: No    Comment: past marijuana   Sexual activity: Not on file  Other Topics Concern   Not on file  Social History Narrative   Not on file   Social Determinants of Health   Financial Resource Strain: Not on file  Food Insecurity: Not on file  Transportation Needs: Not on file  Physical Activity: Not on file  Stress: Not on file  Social Connections: Not on file  Intimate Partner Violence: Not on file    Family History  Problem Relation Age of Onset  Heart failure Father    Heart disease Father     Allergies  Allergen Reactions   Xyzal [Levocetirizine]     Started with vasculitis after taking   Latex Rash    Mask from sleep apnea test    Review of Systems  Constitutional:  Positive for malaise/fatigue.  HENT: Negative.    Eyes: Negative.   Respiratory:  Positive for cough and sputum production.   Cardiovascular: Negative.   Gastrointestinal: Negative.   Genitourinary: Negative.   Musculoskeletal: Negative.   Skin: Negative.   Neurological: Negative.   Endo/Heme/Allergies: Negative.   Psychiatric/Behavioral: Negative.         Objective:   BP (!) 152/72   Pulse 78   Ht 5' 10.5" (1.791 m)   Wt 268 lb (121.6 kg)   SpO2 93%   BMI 37.91 kg/m   Vitals:   10/20/22 1003  BP: (!) 152/72   Pulse: 78  Height: 5' 10.5" (1.791 m)  Weight: 268 lb (121.6 kg)  SpO2: 93%  BMI (Calculated): 37.9    Physical Exam Vitals reviewed.  Constitutional:      Appearance: Normal appearance.  HENT:     Head: Normocephalic.     Nose: Nose normal.     Mouth/Throat:     Mouth: Mucous membranes are moist.  Eyes:     Pupils: Pupils are equal, round, and reactive to light.  Cardiovascular:     Rate and Rhythm: Normal rate and regular rhythm.  Pulmonary:     Effort: Pulmonary effort is normal.     Breath sounds: Normal breath sounds.  Abdominal:     General: Bowel sounds are normal.     Palpations: Abdomen is soft.  Musculoskeletal:        General: Normal range of motion.     Cervical back: Normal range of motion and neck supple.  Skin:    General: Skin is warm and dry.  Neurological:     Mental Status: He is alert and oriented to person, place, and time.  Psychiatric:        Mood and Affect: Mood normal.        Behavior: Behavior normal.      No results found for any visits on 10/20/22.  No results found for this or any previous visit (from the past 2160 hour(s)).    Assessment & Plan:   Problem List Items Addressed This Visit       Cardiovascular and Mediastinum   Essential hypertension - Primary     Respiratory   Allergic rhinitis due to pollen   Upper respiratory tract infection     Other   Hyperlipidemia    No follow-ups on file.   Total time spent: 35 minutes  Orson Eva, NP  10/20/2022

## 2022-10-21 ENCOUNTER — Ambulatory Visit: Payer: Medicare HMO | Attending: Cardiovascular Disease | Admitting: Cardiovascular Disease

## 2022-10-21 ENCOUNTER — Encounter: Payer: Self-pay | Admitting: *Deleted

## 2022-10-21 ENCOUNTER — Encounter: Payer: Self-pay | Admitting: Cardiovascular Disease

## 2022-10-21 VITALS — BP 140/60 | HR 70 | Ht 70.5 in | Wt 269.2 lb

## 2022-10-21 DIAGNOSIS — E785 Hyperlipidemia, unspecified: Secondary | ICD-10-CM | POA: Diagnosis not present

## 2022-10-21 DIAGNOSIS — R011 Cardiac murmur, unspecified: Secondary | ICD-10-CM | POA: Diagnosis not present

## 2022-10-21 DIAGNOSIS — I10 Essential (primary) hypertension: Secondary | ICD-10-CM

## 2022-10-21 DIAGNOSIS — I2510 Atherosclerotic heart disease of native coronary artery without angina pectoris: Secondary | ICD-10-CM

## 2022-10-21 NOTE — Patient Instructions (Signed)
Medication Instructions:  Your Physician recommend you continue on your current medication as directed.     *If you need a refill on your cardiac medications before your next appointment, please call your pharmacy*   Lab Work: No labs today.  If you have labs (blood work) drawn today and your tests are completely normal, you will receive your results only by: MyChart Message (if you have MyChart) OR A paper copy in the mail If you have any lab test that is abnormal or we need to change your treatment, we will call you to review the results.   Testing/Procedures: No test ordered.    Follow-Up: At [facility], you and your health needs are our priority.  As part of our continuing mission to provide you with exceptional heart care, we have created designated Provider Care Teams.  These Care Teams include your primary Cardiologist (physician) and Advanced Practice Providers (APPs -  Physician Assistants and Nurse Practitioners) who all work together to provide you with the care you need, when you need it.  We recommend signing up for the patient portal called "MyChart".  Sign up information is provided on this After Visit Summary.  MyChart is used to connect with patients for Virtual Visits (Telemedicine).  Patients are able to view lab/test results, encounter notes, upcoming appointments, etc.  Non-urgent messages can be sent to your provider as well.   To learn more about what you can do with MyChart, go to [email or web address].    Your next appointment:   12 month(s)  Provider:   You may see Lorine Bears, MD or one of the following Advanced Practice Providers on your designated Care Team:   Nicolasa Ducking, NP Eula Listen, PA-C Cadence Fransico Michael, PA-C Charlsie Quest, NP    Other Instructions Heart-Healthy Eating Plan Many factors influence your heart health, including eating and exercise habits. Heart health is also called coronary health. Coronary risk increases with  abnormal blood fat (lipid) levels. A heart-healthy eating plan includes limiting unhealthy fats, increasing healthy fats, limiting salt (sodium) intake, and making other diet and lifestyle changes. What are tips for following this plan? Cooking Cook foods using methods other than frying. Baking, boiling, grilling, and broiling are all good options. Other ways to reduce fat include: Removing the skin from poultry. Removing all visible fats from meats. Steaming vegetables in water or broth. Meal planning  At meals, imagine dividing your plate into fourths: Fill one-half of your plate with vegetables and green salads. Fill one-fourth of your plate with whole grains. Fill one-fourth of your plate with lean protein foods. Eat 2-4 cups of vegetables per day. One cup of vegetables equals 1 cup (91 g) broccoli or cauliflower florets, 2 medium carrots, 1 large bell pepper, 1 large sweet potato, 1 large tomato, 1 medium white potato, 2 cups (150 g) raw leafy greens. Eat 1-2 cups of fruit per day. One cup of fruit equals 1 small apple, 1 large banana, 1 cup (237 g) mixed fruit, 1 large orange,  cup (82 g) dried fruit, 1 cup (240 mL) 100% fruit juice. Eat more foods that contain soluble fiber. Examples include apples, broccoli, carrots, beans, peas, and barley. Aim to get 25-30 g of fiber per day. Increase your consumption of legumes, nuts, and seeds to 4-5 servings per week. One serving of dried beans or legumes equals  cup (90 g) cooked, 1 serving of nuts is  oz (12 almonds, 24 pistachios, or 7 walnut halves), and 1 serving of seeds  equals  oz (8 g). Fats Choose healthy fats more often. Choose monounsaturated and polyunsaturated fats, such as olive and canola oils, avocado oil, flaxseeds, walnuts, almonds, and seeds. Eat more omega-3 fats. Choose salmon, mackerel, sardines, tuna, flaxseed oil, and ground flaxseeds. Aim to eat fish at least 2 times each week. Check food labels carefully to identify  foods with trans fats or high amounts of saturated fat. Limit saturated fats. These are found in animal products, such as meats, butter, and cream. Plant sources of saturated fats include palm oil, palm kernel oil, and coconut oil. Avoid foods with partially hydrogenated oils in them. These contain trans fats. Examples are stick margarine, some tub margarines, cookies, crackers, and other baked goods. Avoid fried foods. General information Eat more home-cooked food and less restaurant, buffet, and fast food. Limit or avoid alcohol. Limit foods that are high in added sugar and simple starches such as foods made using white refined flour (white breads, pastries, sweets). Lose weight if you are overweight. Losing just 5-10% of your body weight can help your overall health and prevent diseases such as diabetes and heart disease. Monitor your sodium intake, especially if you have high blood pressure. Talk with your health care provider about your sodium intake. Try to incorporate more vegetarian meals weekly. What foods should I eat? Fruits All fresh, canned (in natural juice), or frozen fruits. Vegetables Fresh or frozen vegetables (raw, steamed, roasted, or grilled). Green salads. Grains Most grains. Choose whole wheat and whole grains most of the time. Rice and pasta, including brown rice and pastas made with whole wheat. Meats and other proteins Lean, well-trimmed beef, veal, pork, and lamb. Chicken and [place] without skin. All fish and shellfish. Wild duck, rabbit, pheasant, and venison. Egg whites or low-cholesterol egg substitutes. Dried beans, peas, lentils, and tofu. Seeds and most nuts. Dairy Low-fat or nonfat cheeses, including ricotta and mozzarella. Skim or 1% milk (liquid, powdered, or evaporated). Buttermilk made with low-fat milk. Nonfat or low-fat yogurt. Fats and oils Non-hydrogenated (trans-free) margarines. Vegetable oils, including soybean, sesame, sunflower, olive, avocado,  peanut, safflower, corn, canola, and cottonseed. Salad dressings or mayonnaise made with a vegetable oil. Beverages Water (mineral or sparkling). Coffee and tea. Unsweetened ice tea. Diet beverages. Sweets and desserts Sherbet, gelatin, and fruit ice. Small amounts of dark chocolate. Limit all sweets and desserts. Seasonings and condiments All seasonings and condiments. The items listed above may not be a complete list of foods and beverages you can eat. Contact a dietitian for more options. What foods should I avoid? Fruits Canned fruit in heavy syrup. Fruit in cream or butter sauce. Fried fruit. Limit coconut. Vegetables Vegetables cooked in cheese, cream, or butter sauce. Fried vegetables. Grains Breads made with saturated or trans fats, oils, or whole milk. Croissants. Sweet rolls. Donuts. High-fat crackers, such as cheese crackers and chips. Meats and other proteins Fatty meats, such as hot dogs, ribs, sausage, bacon, rib-eye roast or steak. High-fat deli meats, such as salami and bologna. Caviar. Domestic duck and goose. Organ meats, such as liver. Dairy Cream, sour cream, cream cheese, and creamed cottage cheese. Whole-milk cheeses. Whole or 2% milk (liquid, evaporated, or condensed). Whole buttermilk. Cream sauce or high-fat cheese sauce. Whole-milk yogurt. Fats and oils Meat fat, or shortening. Cocoa butter, hydrogenated oils, palm oil, coconut oil, palm kernel oil. Solid fats and shortenings, including bacon fat, salt pork, lard, and butter. Nondairy cream substitutes. Salad dressings with cheese or sour cream. Beverages Regular sodas and any drinks  with added sugar. Sweets and desserts Frosting. Pudding. Cookies. Cakes. Pies. Milk chocolate or white chocolate. Buttered syrups. Full-fat ice cream or ice cream drinks. The items listed above may not be a complete list of foods and beverages to avoid. Contact a dietitian for more information. Summary Heart-healthy meal planning  includes limiting unhealthy fats, increasing healthy fats, limiting salt (sodium) intake and making other diet and lifestyle changes. Lose weight if you are overweight. Losing just 5-10% of your body weight can help your overall health and prevent diseases such as diabetes and heart disease. Focus on eating a balance of foods, including fruits and vegetables, low-fat or nonfat dairy, lean protein, nuts and legumes, whole grains, and heart-healthy oils and fats. This information is not intended to replace advice given to you by your health care provider. Make sure you discuss any questions you have with your health care provider. Document Revised: 07/27/2021 Document Reviewed: 07/27/2021 Elsevier Patient Education  2023 [employer].

## 2022-10-21 NOTE — Progress Notes (Signed)
Cardiology Office Note   Date:  10/21/2022   ID:  Alan, Robinson 05-20-1956, MRN [id number]  PCP:  Orson Eva, NP  Cardiologist:   Lorine Bears, MD   Chief Complaint  Patient presents with   Follow-up    12 motnh f/u no complaints today. Meds reviewed verbally with pt.       History of Present Illness: [name] is a 67 y.o. male who presents for a follow-up visit regarding coronary artery disease. He has chronic medical conditions including essential hypertension, hyperlipidemia, obesity and type 2 diabetes. He presented in 12/2012 with acute inferior ST elevation myocardial infarction. Cardiac catheterization showed an occluded proximal RCA with mild disease in the left circumflex and LAD. He underwent successful thrombectomy and drug-eluting stent placement to the RCA without complications. Ejection fraction was normal.  He quit smoking since that event. He did have elevated liver enzymes on atorvastatin . He had exertional chest pain in April of 2015. He underwent a nuclear stress test which showed no evidence of ischemia with normal ejection fraction. He has sleep apnea but could not afford CPAP machine. He had an echocardiogram done in October 2022 that showed normal LV systolic function with no significant valvular abnormalities.  He has been doing well with no chest pain, shortness of breath or palpitations.  He has not been able to lose any weight.  Past Medical History:  Diagnosis Date   Coronary artery disease    a. 12/2012 Acute inferior STEMI/PCI: occluded proximal RCA with mild LAD/left circumflex disease. Successful thrombectomy and drug-eluting stent placement to the proximal RCA. EF nl; b. 10/2013 MV: no ischemia/infarct.   Essential hypertension    Heart murmur    Hyperlipidemia    Morbid obesity    Motion sickness    sea sick   Myocardial infarction 12/2012   Near syncope    a. ER visit 01/2015 - ? etiology.   Nonadherence to medication     Sleep apnea    No CPAP   Stomach ulcer    Vasculitis 2017   Treated with prednisone.   Wears dentures    Has full upper and lower.  Does not wear lower    Past Surgical History:  Procedure Laterality Date   APPENDECTOMY     CARDIAC CATHETERIZATION  6/14   [facility];EF 65%   CATARACT EXTRACTION W/PHACO Left 04/14/2022   Procedure: CATARACT EXTRACTION PHACO AND INTRAOCULAR LENS PLACEMENT (IOC) LEFT DIABETIC;  Surgeon: Lockie Mola, MD;  Location: [facility];  Service: Ophthalmology;  Laterality: Left;  sleep apnea 14.48 01:42.0   CATARACT EXTRACTION W/PHACO Right 04/28/2022   Procedure: CATARACT EXTRACTION PHACO AND INTRAOCULAR LENS PLACEMENT (IOC) RIGHT  DIABETIC 10.61 01:04.2;  Surgeon: Lockie Mola, MD;  Location: [facility];  Service: Ophthalmology;  Laterality: Right;  sleep apnea   COLONOSCOPY WITH PROPOFOL [name] 08/18/2016   Procedure: COLONOSCOPY WITH PROPOFOL;  Surgeon: Scot Jun, MD;  Location: [facility] END[facility];  Service: Endoscopy;  Laterality: [name];   HERNIA REPAIR       Current Outpatient Medications  Medication Sig Dispense Refill   amLODipine (NORVASC) 5 MG tablet TAKE 1 TABLET BY MOUTH DAILY 90 tablet 1   amoxicillin-clavulanate (AUGMENTIN) 875-125 MG tablet Take 1 tablet by mouth 2 (two) times daily.     carvedilol (COREG) 12.5 MG tablet TAKE 1 TABLET BY MOUTH TWICE A DAY 60 tablet 0   Cetirizine HCl (ZYRTEC PO) Take by mouth daily.  Dextromethorphan HBr (DELSYM PO) Take by mouth at bedtime.     glipiZIDE (GLUCOTROL XL) 2.5 MG 24 hr tablet Take 2.5 mg by mouth daily with breakfast.     lisinopril (ZESTRIL) 40 MG tablet TAKE 1 TABLET BY MOUTH DAILY please schedule annual follow up 90 tablet 0   nitroGLYCERIN (NITROSTAT) 0.4 MG SL tablet Place 1 tablet (0.4 mg total) under the tongue every 5 (five) minutes as needed for chest pain. 25 tablet 3   rosuvastatin (CRESTOR) 20 MG tablet Take 1 tablet (20 mg total) by mouth daily. 90  tablet 0   vitamin D3 (CHOLECALCIFEROL) 25 MCG tablet Take 1,000 Units by mouth daily.     No current facility-administered medications for this visit.    Allergies:   Xyzal [levocetirizine] and Latex    Social History:  The patient  reports that he quit smoking about 9 years ago. His smoking use included cigarettes. He has a 30.00 pack-year smoking history. He has never used smokeless tobacco. He reports that he does not drink alcohol and does not use drugs.   Family History:  The patient's family history includes Heart disease in his father; Heart failure in his father.    ROS:  Please see the history of present illness.   Otherwise, review of systems are positive for none.   All other systems are reviewed and negative.    PHYSICAL EXAM: VS:  BP (!) 140/60 (BP Location: Left Arm, Patient Position: Sitting, Cuff Size: Large)   Pulse 70   Ht 5' 10.5" (1.791 m)   Wt 269 lb 4 oz (122.1 kg)   SpO2 97%   BMI 38.09 kg/m  , BMI Body mass index is 38.09 kg/m. GEN: Well nourished, well developed, in no acute distress  HEENT: normal  Neck: no JVD, carotid bruits, or masses Cardiac: RRR; no rubs, or gallops,no edema .  2 /6 systolic murmur in the aortic area Respiratory:  clear to auscultation bilaterally, normal work of breathing GI: soft, nontender, nondistended, + BS MS: no deformity or atrophy  Skin: warm and dry, no rash Neuro:  Strength and sensation are intact Psych: euthymic mood, full affect   EKG:  EKG is ordered today. The ekg ordered today demonstrates normal sinus rhythm with left axis deviation and nonspecific T wave changes.   Recent Labs: 10/19/2022: ALT 41; BUN 17; Creatinine, Ser 1.13; Potassium 4.9; Sodium 144; TSH 1.650    Lipid Panel    Component Value Date/Time   CHOL 134 10/19/2022 0849   CHOL 212 (H) 12/19/2012 0601   TRIG 195 (H) 10/19/2022 0849   TRIG 142 12/19/2012 0601   HDL 38 (L) 10/19/2022 0849   HDL 31 (L) 12/19/2012 0601   CHOLHDL 2.7  01/26/2016 0739   VLDL 28 12/19/2012 0601   LDLCALC 64 10/19/2022 0849   LDLCALC 153 (H) 12/19/2012 0601      Wt Readings from Last 3 Encounters:  10/21/22 269 lb 4 oz (122.1 kg)  10/20/22 268 lb (121.6 kg)  04/28/22 267 lb (121.1 kg)            No data to display            ASSESSMENT AND PLAN:   1.  Coronary artery disease involving native coronary arteries without angina:He is overall doing reasonably well with no anginal symptoms.  Continue medical therapy.  2. Essential hypertension: Blood pressure is reasonably controlled on current medications.  3. Hyperlipidemia: I reviewed his recent labs which showed an  LDL of 64 and triglyceride of 195.  Continue treatment with rosuvastatin.  I discussed with him the importance of heart healthy diet.  4.  Obesity: I again discussed with him the importance of healthy lifestyle changes and regular exercise.  5.  Cardiac murmur: Likely due to aortic sclerosis.  Echocardiogram in October 2022 showed no significant stenosis.  No significant change in the murmur.   Disposition:   FU with me in 12 months  Signed,  Lorine Bears, MD  10/21/2022 8:39 AM    [facility]

## 2022-11-16 ENCOUNTER — Ambulatory Visit (INDEPENDENT_AMBULATORY_CARE_PROVIDER_SITE_OTHER): Payer: Medicare HMO

## 2022-11-16 ENCOUNTER — Encounter: Payer: Self-pay | Admitting: Nurse Practitioner

## 2022-11-16 ENCOUNTER — Ambulatory Visit (INDEPENDENT_AMBULATORY_CARE_PROVIDER_SITE_OTHER): Payer: Medicare HMO | Admitting: Nurse Practitioner

## 2022-11-16 ENCOUNTER — Other Ambulatory Visit: Payer: Self-pay | Admitting: Cardiovascular Disease

## 2022-11-16 VITALS — BP 132/74 | HR 82 | Ht 70.5 in | Wt 272.0 lb

## 2022-11-16 DIAGNOSIS — J069 Acute upper respiratory infection, unspecified: Secondary | ICD-10-CM

## 2022-11-16 DIAGNOSIS — J301 Allergic rhinitis due to pollen: Secondary | ICD-10-CM

## 2022-11-16 DIAGNOSIS — I10 Essential (primary) hypertension: Secondary | ICD-10-CM | POA: Diagnosis not present

## 2022-11-16 DIAGNOSIS — I2510 Atherosclerotic heart disease of native coronary artery without angina pectoris: Secondary | ICD-10-CM

## 2022-11-16 MED ORDER — DOXYCYCLINE HYCLATE 100 MG PO CAPS: 100.0000 mg | ORAL_CAPSULE | Freq: Two times a day (BID) | ORAL | 0 refills | Status: DC

## 2022-11-16 NOTE — Progress Notes (Signed)
Established Patient Office Visit  Subjective:  Patient ID: [name], male    DOB: 1955/11/27  Age: 67 y.o. MRN: [id number]  Chief Complaint  Patient presents with   Follow-up    Follow up    Follow up for sinusitis that patient took augmentin for but reports it is not completely resolved.  Coughing phlegm in morning.  No energy, yellow colored sputum.      No other concerns at this time.   Past Medical History:  Diagnosis Date   Coronary artery disease    a. 12/2012 Acute inferior STEMI/PCI: occluded proximal RCA with mild LAD/left circumflex disease. Successful thrombectomy and drug-eluting stent placement to the proximal RCA. EF nl; b. 10/2013 MV: no ischemia/infarct.   Essential hypertension    Heart murmur    Hyperlipidemia    Morbid obesity (HCC)    Motion sickness    sea sick   Myocardial infarction [facility]) 12/2012   Near syncope    a. ER visit 01/2015 - ? etiology.   Nonadherence to medication    Sleep apnea    No CPAP   Stomach ulcer    Vasculitis (HCC) 2017   Treated with prednisone.   Wears dentures    Has full upper and lower.  Does not wear lower    Past Surgical History:  Procedure Laterality Date   APPENDECTOMY     CARDIAC CATHETERIZATION  6/14   [facility];EF 65%   CATARACT EXTRACTION W/PHACO Left 04/14/2022   Procedure: CATARACT EXTRACTION PHACO AND INTRAOCULAR LENS PLACEMENT (IOC) LEFT DIABETIC;  Surgeon: Lockie Mola, MD;  Location: [facility];  Service: Ophthalmology;  Laterality: Left;  sleep apnea 14.48 01:42.0   CATARACT EXTRACTION W/PHACO Right 04/28/2022   Procedure: CATARACT EXTRACTION PHACO AND INTRAOCULAR LENS PLACEMENT (IOC) RIGHT  DIABETIC 10.61 01:04.2;  Surgeon: Lockie Mola, MD;  Location: [facility];  Service: Ophthalmology;  Laterality: Right;  sleep apnea   COLONOSCOPY WITH PROPOFOL [name] 08/18/2016   Procedure: COLONOSCOPY WITH PROPOFOL;  Surgeon: Scot Jun, MD;  Location: [facility] ENDOSCOPY;   Service: Endoscopy;  Laterality: [name];   HERNIA REPAIR      Social History   Socioeconomic History   Marital status: Divorced    Spouse name: Not on file   Number of children: Not on file   Years of education: Not on file   Highest education level: Not on file  Occupational History   Not on file  Tobacco Use   Smoking status: Former    Packs/day: 1.00    Years: 30.00    Additional pack years: 0.00    Total pack years: 30.00    Types: Cigarettes    Quit date: 12/18/2012    Years since quitting: 9.9   Smokeless tobacco: Never  Vaping Use   Vaping Use: Never used  Substance and Sexual Activity   Alcohol use: No    Comment: history of ETOH last drink 1998   Drug use: No    Comment: past marijuana   Sexual activity: Not on file  Other Topics Concern   Not on file  Social History Narrative   Not on file   Social Determinants of Health   Financial Resource Strain: Not on file  Food Insecurity: Not on file  Transportation Needs: Not on file  Physical Activity: Not on file  Stress: Not on file  Social Connections: Not on file  Intimate Partner Violence: Not on file    Family History  Problem Relation Age  of Onset   Heart failure Father    Heart disease Father     Allergies  Allergen Reactions   Xyzal [Levocetirizine]     Started with vasculitis after taking   Latex Rash    Mask from sleep apnea test    Review of Systems  Constitutional: Negative.   HENT: Negative.    Eyes: Negative.   Respiratory:  Positive for cough.   Cardiovascular: Negative.   Gastrointestinal: Negative.   Genitourinary: Negative.   Musculoskeletal: Negative.   Skin: Negative.   Neurological: Negative.   Endo/Heme/Allergies: Negative.   Psychiatric/Behavioral: Negative.         Objective:   Ht 5' 10.5" (1.791 m)   Wt 272 lb (123.4 kg)   BMI 38.48 kg/m   Vitals:   11/16/22 1121  Height: 5' 10.5" (1.791 m)  Weight: 272 lb (123.4 kg)  BMI (Calculated): 38.46     Physical Exam Vitals reviewed.  Constitutional:      Appearance: Normal appearance.  HENT:     Head: Normocephalic.     Nose: Nose normal.     Mouth/Throat:     Mouth: Mucous membranes are moist.  Eyes:     Pupils: Pupils are equal, round, and reactive to light.  Cardiovascular:     Rate and Rhythm: Normal rate and regular rhythm.  Pulmonary:     Effort: Pulmonary effort is normal.     Breath sounds: Normal breath sounds.  Abdominal:     General: Bowel sounds are normal.     Palpations: Abdomen is soft.  Musculoskeletal:        General: Normal range of motion.     Cervical back: Normal range of motion and neck supple.  Skin:    General: Skin is warm and dry.  Neurological:     Mental Status: He is alert and oriented to person, place, and time.  Psychiatric:        Mood and Affect: Mood normal.        Behavior: Behavior normal.      No results found for any visits on 11/16/22.  Recent Results (from the past 2160 hour(s))  Comprehensive metabolic panel     Status: Abnormal   Collection Time: 10/19/22  8:49 AM  Result Value Ref Range   Glucose 104 (H) 70 - 99 mg/dL   BUN 17 8 - 27 mg/dL   Creatinine, Ser 8.29 0.76 - 1.27 mg/dL   eGFR 71 >56 OZ/HYQ/6.57   BUN/Creatinine Ratio 15 10 - 24   Sodium 144 134 - 144 mmol/L   Potassium 4.9 3.5 - 5.2 mmol/L   Chloride 101 96 - 106 mmol/L   CO2 24 20 - 29 mmol/L   Calcium 10.2 8.6 - 10.2 mg/dL   Total Protein 8.2 6.0 - 8.5 g/dL   Albumin 4.6 3.9 - 4.9 g/dL   Globulin, Total 3.6 1.5 - 4.5 g/dL   Albumin/Globulin Ratio 1.3 1.2 - 2.2   Bilirubin Total 1.3 (H) 0.0 - 1.2 mg/dL   Alkaline Phosphatase 58 44 - 121 IU/L   AST 27 0 - 40 IU/L   ALT 41 0 - 44 IU/L  Lipid Panel w/o Chol/HDL Ratio     Status: Abnormal   Collection Time: 10/19/22  8:49 AM  Result Value Ref Range   Cholesterol, Total 134 100 - 199 mg/dL   Triglycerides 846 (H) 0 - 149 mg/dL   HDL 38 (L) >96 mg/dL   VLDL Cholesterol Cal 32 5 -  40 mg/dL   LDL  Chol Calc (NIH) 64 0 - 99 mg/dL  Hgb Z6X w/o eAG     Status: Abnormal   Collection Time: 10/19/22  8:49 AM  Result Value Ref Range   Hgb A1c MFr Bld 5.7 (H) 4.8 - 5.6 %    Comment:          Prediabetes: 5.7 - 6.4          Diabetes: >6.4          Glycemic control for adults with diabetes: <7.0   TSH     Status: None   Collection Time: 10/19/22  8:49 AM  Result Value Ref Range   TSH 1.650 0.450 - 4.500 uIU/mL      Assessment & Plan:   Problem List Items Addressed This Visit   None   No follow-ups on file.   Total time spent: 20 minutes  Orson Eva, NP  11/16/2022

## 2022-11-16 NOTE — Patient Instructions (Addendum)
1) CXR  2) Doxy 3) Follow up appt in 1 week

## 2022-11-23 DIAGNOSIS — E119 Type 2 diabetes mellitus without complications: Secondary | ICD-10-CM | POA: Diagnosis not present

## 2022-11-23 DIAGNOSIS — Z961 Presence of intraocular lens: Secondary | ICD-10-CM | POA: Diagnosis not present

## 2022-11-23 DIAGNOSIS — H40053 Ocular hypertension, bilateral: Secondary | ICD-10-CM | POA: Diagnosis not present

## 2022-11-25 ENCOUNTER — Ambulatory Visit (INDEPENDENT_AMBULATORY_CARE_PROVIDER_SITE_OTHER): Payer: Medicare HMO | Admitting: Nurse Practitioner

## 2022-11-25 VITALS — BP 150/70 | HR 71 | Ht 70.5 in | Wt 269.8 lb

## 2022-11-25 DIAGNOSIS — E782 Mixed hyperlipidemia: Secondary | ICD-10-CM | POA: Diagnosis not present

## 2022-11-25 DIAGNOSIS — I10 Essential (primary) hypertension: Secondary | ICD-10-CM | POA: Diagnosis not present

## 2022-11-25 DIAGNOSIS — I2510 Atherosclerotic heart disease of native coronary artery without angina pectoris: Secondary | ICD-10-CM

## 2022-11-25 DIAGNOSIS — J301 Allergic rhinitis due to pollen: Secondary | ICD-10-CM | POA: Diagnosis not present

## 2022-11-25 NOTE — Patient Instructions (Signed)
1) Follow up appt in 3 months, 2 weeks,fasting labs prior 

## 2022-11-25 NOTE — Progress Notes (Signed)
Established Patient Office Visit  Subjective:  Patient ID: [name], male    DOB: 12-08-1955  Age: 67 y.o. MRN: [id number]  Chief Complaint  Patient presents with   Follow-up    1 week follow up    1 week follow up.  Recent CXR was neg for PNA, hyperinflation present.  Patient reports no symptoms, no nasal drainage, no cough.      No other concerns at this time.   Past Medical History:  Diagnosis Date   Coronary artery disease    a. 12/2012 Acute inferior STEMI/PCI: occluded proximal RCA with mild LAD/left circumflex disease. Successful thrombectomy and drug-eluting stent placement to the proximal RCA. EF nl; b. 10/2013 MV: no ischemia/infarct.   Essential hypertension    Heart murmur    Hyperlipidemia    Morbid obesity (HCC)    Motion sickness    sea sick   Myocardial infarction [facility]) 12/2012   Near syncope    a. ER visit 01/2015 - ? etiology.   Nonadherence to medication    Sleep apnea    No CPAP   Stomach ulcer    Vasculitis (HCC) 2017   Treated with prednisone.   Wears dentures    Has full upper and lower.  Does not wear lower    Past Surgical History:  Procedure Laterality Date   APPENDECTOMY     CARDIAC CATHETERIZATION  6/14   [facility];EF 65%   CATARACT EXTRACTION W/PHACO Left 04/14/2022   Procedure: CATARACT EXTRACTION PHACO AND INTRAOCULAR LENS PLACEMENT (IOC) LEFT DIABETIC;  Surgeon: Lockie Mola, MD;  Location: [facility];  Service: Ophthalmology;  Laterality: Left;  sleep apnea 14.48 01:42.0   CATARACT EXTRACTION W/PHACO Right 04/28/2022   Procedure: CATARACT EXTRACTION PHACO AND INTRAOCULAR LENS PLACEMENT (IOC) RIGHT  DIABETIC 10.61 01:04.2;  Surgeon: Lockie Mola, MD;  Location: [facility];  Service: Ophthalmology;  Laterality: Right;  sleep apnea   COLONOSCOPY WITH PROPOFOL [name] 08/18/2016   Procedure: COLONOSCOPY WITH PROPOFOL;  Surgeon: Scot Jun, MD;  Location: [facility] ENDOSCOPY;  Service: Endoscopy;   Laterality: [name];   HERNIA REPAIR      Social History   Socioeconomic History   Marital status: Divorced    Spouse name: Not on file   Number of children: Not on file   Years of education: Not on file   Highest education level: Not on file  Occupational History   Not on file  Tobacco Use   Smoking status: Former    Packs/day: 1.00    Years: 30.00    Additional pack years: 0.00    Total pack years: 30.00    Types: Cigarettes    Quit date: 12/18/2012    Years since quitting: 9.9   Smokeless tobacco: Never  Vaping Use   Vaping Use: Never used  Substance and Sexual Activity   Alcohol use: No    Comment: history of ETOH last drink 1998   Drug use: No    Comment: past marijuana   Sexual activity: Not on file  Other Topics Concern   Not on file  Social History Narrative   Not on file   Social Determinants of Health   Financial Resource Strain: Not on file  Food Insecurity: Not on file  Transportation Needs: Not on file  Physical Activity: Not on file  Stress: Not on file  Social Connections: Not on file  Intimate Partner Violence: Not on file    Family History  Problem Relation Age of Onset  Heart failure Father    Heart disease Father     Allergies  Allergen Reactions   Xyzal [Levocetirizine]     Started with vasculitis after taking   Latex Rash    Mask from sleep apnea test    Review of Systems  Constitutional: Negative.   HENT: Negative.    Eyes: Negative.   Respiratory: Negative.    Cardiovascular: Negative.   Gastrointestinal: Negative.   Genitourinary: Negative.   Musculoskeletal: Negative.   Skin: Negative.   Neurological: Negative.   Endo/Heme/Allergies: Negative.   Psychiatric/Behavioral: Negative.         Objective:   There were no vitals taken for this visit.  There were no vitals filed for this visit.  Physical Exam Vitals reviewed.  Constitutional:      Appearance: Normal appearance.  HENT:     Head: Normocephalic.      Nose: Nose normal.  Eyes:     Pupils: Pupils are equal, round, and reactive to light.  Cardiovascular:     Rate and Rhythm: Normal rate and regular rhythm.  Pulmonary:     Effort: Pulmonary effort is normal.     Breath sounds: Normal breath sounds.  Abdominal:     General: Bowel sounds are normal.     Palpations: Abdomen is soft.  Musculoskeletal:        General: Normal range of motion.     Cervical back: Normal range of motion and neck supple.  Skin:    General: Skin is warm and dry.  Neurological:     Mental Status: He is alert and oriented to person, place, and time.  Psychiatric:        Mood and Affect: Mood normal.        Behavior: Behavior normal.      No results found for any visits on 11/25/22.  Recent Results (from the past 2160 hour(s))  Comprehensive metabolic panel     Status: Abnormal   Collection Time: 10/19/22  8:49 AM  Result Value Ref Range   Glucose 104 (H) 70 - 99 mg/dL   BUN 17 8 - 27 mg/dL   Creatinine, Ser 1.61 0.76 - 1.27 mg/dL   eGFR 71 >09 UE/AVW/0.98   BUN/Creatinine Ratio 15 10 - 24   Sodium 144 134 - 144 mmol/L   Potassium 4.9 3.5 - 5.2 mmol/L   Chloride 101 96 - 106 mmol/L   CO2 24 20 - 29 mmol/L   Calcium 10.2 8.6 - 10.2 mg/dL   Total Protein 8.2 6.0 - 8.5 g/dL   Albumin 4.6 3.9 - 4.9 g/dL   Globulin, Total 3.6 1.5 - 4.5 g/dL   Albumin/Globulin Ratio 1.3 1.2 - 2.2   Bilirubin Total 1.3 (H) 0.0 - 1.2 mg/dL   Alkaline Phosphatase 58 44 - 121 IU/L   AST 27 0 - 40 IU/L   ALT 41 0 - 44 IU/L  Lipid Panel w/o Chol/HDL Ratio     Status: Abnormal   Collection Time: 10/19/22  8:49 AM  Result Value Ref Range   Cholesterol, Total 134 100 - 199 mg/dL   Triglycerides 119 (H) 0 - 149 mg/dL   HDL 38 (L) >14 mg/dL   VLDL Cholesterol Cal 32 5 - 40 mg/dL   LDL Chol Calc (NIH) 64 0 - 99 mg/dL  Hgb N8G w/o eAG     Status: Abnormal   Collection Time: 10/19/22  8:49 AM  Result Value Ref Range   Hgb A1c MFr Bld 5.7 (H)  4.8 - 5.6 %    Comment:           Prediabetes: 5.7 - 6.4          Diabetes: >6.4          Glycemic control for adults with diabetes: <7.0   TSH     Status: None   Collection Time: 10/19/22  8:49 AM  Result Value Ref Range   TSH 1.650 0.450 - 4.500 uIU/mL      Assessment & Plan:   Problem List Items Addressed This Visit   None   No follow-ups on file.   Total time spent: 15 minutes  Orson Eva, NP  11/25/2022   This document may have been prepared by [facility] Voice Recognition software and as such may include unintentional dictation errors.

## 2022-12-02 ENCOUNTER — Other Ambulatory Visit: Payer: Self-pay | Admitting: Cardiovascular Disease

## 2023-01-20 ENCOUNTER — Other Ambulatory Visit: Payer: Self-pay | Admitting: Cardiovascular Disease

## 2023-02-17 ENCOUNTER — Other Ambulatory Visit: Payer: Self-pay | Admitting: Cardiovascular Disease

## 2023-02-25 ENCOUNTER — Ambulatory Visit: Payer: Medicare HMO | Admitting: Cardiology

## 2023-02-28 ENCOUNTER — Other Ambulatory Visit: Payer: Medicare HMO

## 2023-02-28 DIAGNOSIS — E669 Obesity, unspecified: Secondary | ICD-10-CM | POA: Diagnosis not present

## 2023-02-28 DIAGNOSIS — R7301 Impaired fasting glucose: Secondary | ICD-10-CM

## 2023-02-28 DIAGNOSIS — I10 Essential (primary) hypertension: Secondary | ICD-10-CM | POA: Diagnosis not present

## 2023-02-28 DIAGNOSIS — E782 Mixed hyperlipidemia: Secondary | ICD-10-CM

## 2023-03-01 LAB — CBC WITH DIFFERENTIAL/PLATELET
Basophils Absolute: 0.1 10*3/uL (ref 0.0–0.2)
Basos: 1 %
EOS (ABSOLUTE): 0.3 10*3/uL (ref 0.0–0.4)
Eos: 7 %
Hematocrit: 46.7 % (ref 37.5–51.0)
Hemoglobin: 14.9 g/dL (ref 13.0–17.7)
Immature Grans (Abs): 0 10*3/uL (ref 0.0–0.1)
Immature Granulocytes: 0 %
Lymphocytes Absolute: 1.4 10*3/uL (ref 0.7–3.1)
Lymphs: 27 %
MCH: 28.9 pg (ref 26.6–33.0)
MCHC: 31.9 g/dL (ref 31.5–35.7)
MCV: 91 fL (ref 79–97)
Monocytes Absolute: 0.6 10*3/uL (ref 0.1–0.9)
Monocytes: 11 %
Neutrophils Absolute: 2.6 10*3/uL (ref 1.4–7.0)
Neutrophils: 54 %
Platelets: 220 10*3/uL (ref 150–450)
RBC: 5.16 x10E6/uL (ref 4.14–5.80)
RDW: 13.2 % (ref 11.6–15.4)
WBC: 4.9 10*3/uL (ref 3.4–10.8)

## 2023-03-01 LAB — CMP14+EGFR
ALT: 34 IU/L (ref 0–44)
AST: 29 IU/L (ref 0–40)
Albumin: 4.5 g/dL (ref 3.9–4.9)
Alkaline Phosphatase: 47 IU/L (ref 44–121)
BUN/Creatinine Ratio: 13 (ref 10–24)
BUN: 14 mg/dL (ref 8–27)
Bilirubin Total: 1.6 mg/dL — ABNORMAL HIGH (ref 0.0–1.2)
CO2: 27 mmol/L (ref 20–29)
Calcium: 9.7 mg/dL (ref 8.6–10.2)
Chloride: 99 mmol/L (ref 96–106)
Creatinine, Ser: 1.06 mg/dL (ref 0.76–1.27)
Globulin, Total: 3 g/dL (ref 1.5–4.5)
Glucose: 110 mg/dL — ABNORMAL HIGH (ref 70–99)
Potassium: 5.1 mmol/L (ref 3.5–5.2)
Sodium: 140 mmol/L (ref 134–144)
Total Protein: 7.5 g/dL (ref 6.0–8.5)
eGFR: 77 mL/min/{1.73_m2} (ref >59)

## 2023-03-01 LAB — TSH: TSH: 0.922 u[IU]/mL (ref 0.450–4.500)

## 2023-03-01 LAB — LIPID PANEL
Chol/HDL Ratio: 3.1 ratio (ref 0.0–5.0)
Cholesterol, Total: 123 mg/dL (ref 100–199)
HDL: 40 mg/dL (ref >39)
LDL Chol Calc (NIH): 68 mg/dL (ref 0–99)
Triglycerides: 71 mg/dL (ref 0–149)
VLDL Cholesterol Cal: 15 mg/dL (ref 5–40)

## 2023-03-01 LAB — HEMOGLOBIN A1C
Est. average glucose Bld gHb Est-mCnc: 126 mg/dL
Hgb A1c MFr Bld: 6 % — ABNORMAL HIGH (ref 4.8–5.6)

## 2023-03-03 ENCOUNTER — Encounter: Payer: Self-pay | Admitting: Cardiology

## 2023-03-03 ENCOUNTER — Ambulatory Visit (INDEPENDENT_AMBULATORY_CARE_PROVIDER_SITE_OTHER): Payer: Medicare HMO | Admitting: Cardiology

## 2023-03-03 VITALS — BP 132/78 | HR 72 | Ht 70.5 in | Wt 272.8 lb

## 2023-03-03 DIAGNOSIS — I10 Essential (primary) hypertension: Secondary | ICD-10-CM

## 2023-03-03 DIAGNOSIS — R7303 Prediabetes: Secondary | ICD-10-CM | POA: Diagnosis not present

## 2023-03-03 DIAGNOSIS — E782 Mixed hyperlipidemia: Secondary | ICD-10-CM

## 2023-03-03 NOTE — Progress Notes (Signed)
Established Patient Office Visit  Subjective:  Patient ID: [name], male    DOB: Dec 26, 1955  Age: 67 y.o. MRN: [id number]  Chief Complaint  Patient presents with   Follow-up    3 month follow up    Patient in office for 3 month follow up. Patient complains of bilateral lower extremity edema. Denies shortness of breath. Discussed furosemide, patient does not want to take a new medication. Recommend patient elevated legs when sitting. Discussed compression hose, patient declines at this time. Instructed patient to call cardiologist if edema persists or if he becomes short of breath.  Discussed recent lab work. Hgb A1c worse. Patient unsure when he stopped taking glipizide. Patient to work on strict diabetic diet and exercise.     No other concerns at this time.   Past Medical History:  Diagnosis Date   Coronary artery disease    a. 12/2012 Acute inferior STEMI/PCI: occluded proximal RCA with mild LAD/left circumflex disease. Successful thrombectomy and drug-eluting stent placement to the proximal RCA. EF nl; b. 10/2013 MV: no ischemia/infarct.   Essential hypertension    Heart murmur    Hyperlipidemia    Morbid obesity (HCC)    Motion sickness    sea sick   Myocardial infarction [facility]) 12/2012   Near syncope    a. ER visit 01/2015 - ? etiology.   Nonadherence to medication    Sleep apnea    No CPAP   Stomach ulcer    Vasculitis (HCC) 2017   Treated with prednisone.   Wears dentures    Has full upper and lower.  Does not wear lower    Past Surgical History:  Procedure Laterality Date   APPENDECTOMY     CARDIAC CATHETERIZATION  6/14   [facility];EF 65%   CATARACT EXTRACTION W/PHACO Left 04/14/2022   Procedure: CATARACT EXTRACTION PHACO AND INTRAOCULAR LENS PLACEMENT (IOC) LEFT DIABETIC;  Surgeon: Lockie Mola, MD;  Location: [facility];  Service: Ophthalmology;  Laterality: Left;  sleep apnea 14.48 01:42.0   CATARACT EXTRACTION W/PHACO Right 04/28/2022    Procedure: CATARACT EXTRACTION PHACO AND INTRAOCULAR LENS PLACEMENT (IOC) RIGHT  DIABETIC 10.61 01:04.2;  Surgeon: Lockie Mola, MD;  Location: [facility];  Service: Ophthalmology;  Laterality: Right;  sleep apnea   COLONOSCOPY WITH PROPOFOL [name] 08/18/2016   Procedure: COLONOSCOPY WITH PROPOFOL;  Surgeon: Scot Jun, MD;  Location: [facility];  Service: Endoscopy;  Laterality: [name];   HERNIA REPAIR      Social History   Socioeconomic History   Marital status: Divorced    Spouse name: Not on file   Number of children: Not on file   Years of education: Not on file   Highest education level: Not on file  Occupational History   Not on file  Tobacco Use   Smoking status: Former    Current packs/day: 0.00    Average packs/day: 1 pack/day for 30.0 years (30.0 ttl pk-yrs)    Types: Cigarettes    Start date: 12/19/1982    Quit date: 12/18/2012    Years since quitting: 10.2   Smokeless tobacco: Never  Vaping Use   Vaping status: Never Used  Substance and Sexual Activity   Alcohol use: No    Comment: history of ETOH last drink 1998   Drug use: No    Comment: past marijuana   Sexual activity: Not on file  Other Topics Concern   Not on file  Social History Narrative   Not on file  Social Determinants of Health   Financial Resource Strain: Not on file  Food Insecurity: Not on file  Transportation Needs: Not on file  Physical Activity: Not on file  Stress: Not on file  Social Connections: Not on file  Intimate Partner Violence: Not on file    Family History  Problem Relation Age of Onset   Heart failure Father    Heart disease Father     Allergies  Allergen Reactions   Xyzal [Levocetirizine]     Started with vasculitis after taking   Latex Rash    Mask from sleep apnea test    Review of Systems  Constitutional: Negative.   HENT: Negative.    Eyes: Negative.   Respiratory: Negative.  Negative for shortness of breath.   Cardiovascular:   Positive for leg swelling. Negative for chest pain.  Gastrointestinal: Negative.  Negative for abdominal pain, constipation and diarrhea.  Genitourinary: Negative.   Musculoskeletal:  Negative for joint pain and myalgias.  Skin: Negative.   Neurological: Negative.  Negative for dizziness and headaches.  Endo/Heme/Allergies: Negative.   All other systems reviewed and are negative.      Objective:   BP 132/78 (BP Location: Left Arm, Patient Position: Sitting, Cuff Size: Large)   Pulse 72   Ht 5' 10.5" (1.791 m)   Wt 272 lb 12.8 oz (123.7 kg)   SpO2 96%   BMI 38.59 kg/m   Vitals:   03/03/23 1030 03/03/23 1047  BP: (!) 150/78 132/78  Pulse: 72   Height: 5' 10.5" (1.791 m)   Weight: 272 lb 12.8 oz (123.7 kg)   SpO2: 96%   BMI (Calculated): 38.58     Physical Exam Nursing note reviewed.  Constitutional:      Appearance: Normal appearance. He is normal weight.  HENT:     Head: Normocephalic and atraumatic.     Nose: Nose normal.     Mouth/Throat:     Mouth: Mucous membranes are moist.     Pharynx: Oropharynx is clear.  Eyes:     Extraocular Movements: Extraocular movements intact.     Conjunctiva/sclera: Conjunctivae normal.     Pupils: Pupils are equal, round, and reactive to light.  Cardiovascular:     Rate and Rhythm: Normal rate and regular rhythm.     Pulses: Normal pulses.     Heart sounds: Normal heart sounds.  Pulmonary:     Effort: Pulmonary effort is normal.     Breath sounds: Normal breath sounds.  Abdominal:     General: Abdomen is flat. Bowel sounds are normal.     Palpations: Abdomen is soft.  Musculoskeletal:        General: Normal range of motion.     Cervical back: Normal range of motion.     Right lower leg: Edema present.     Left lower leg: Edema present.  Skin:    General: Skin is warm and dry.  Neurological:     General: No focal deficit present.     Mental Status: He is alert and oriented to person, place, and time.  Psychiatric:         Mood and Affect: Mood normal.        Behavior: Behavior normal.        Thought Content: Thought content normal.        Judgment: Judgment normal.      No results found for any visits on 03/03/23.  Recent Results (from the past 2160 hour(s))  Hemoglobin A1c  Status: Abnormal   Collection Time: 02/28/23 12:03 PM  Result Value Ref Range   Hgb A1c MFr Bld 6.0 (H) 4.8 - 5.6 %    Comment:          Prediabetes: 5.7 - 6.4          Diabetes: >6.4          Glycemic control for adults with diabetes: <7.0    Est. average glucose Bld gHb Est-mCnc 126 mg/dL  TSH     Status: None   Collection Time: 02/28/23 12:03 PM  Result Value Ref Range   TSH 0.922 0.450 - 4.500 uIU/mL  CMP14+EGFR     Status: Abnormal   Collection Time: 02/28/23 12:03 PM  Result Value Ref Range   Glucose 110 (H) 70 - 99 mg/dL   BUN 14 8 - 27 mg/dL   Creatinine, Ser 4.09 0.76 - 1.27 mg/dL   eGFR 77 >81 XB/JYN/8.29   BUN/Creatinine Ratio 13 10 - 24   Sodium 140 134 - 144 mmol/L   Potassium 5.1 3.5 - 5.2 mmol/L   Chloride 99 96 - 106 mmol/L   CO2 27 20 - 29 mmol/L   Calcium 9.7 8.6 - 10.2 mg/dL   Total Protein 7.5 6.0 - 8.5 g/dL   Albumin 4.5 3.9 - 4.9 g/dL   Globulin, Total 3.0 1.5 - 4.5 g/dL   Bilirubin Total 1.6 (H) 0.0 - 1.2 mg/dL   Alkaline Phosphatase 47 44 - 121 IU/L   AST 29 0 - 40 IU/L   ALT 34 0 - 44 IU/L  Lipid panel     Status: None   Collection Time: 02/28/23 12:03 PM  Result Value Ref Range   Cholesterol, Total 123 100 - 199 mg/dL   Triglycerides 71 0 - 149 mg/dL   HDL 40 >56 mg/dL   VLDL Cholesterol Cal 15 5 - 40 mg/dL   LDL Chol Calc (NIH) 68 0 - 99 mg/dL   Chol/HDL Ratio 3.1 0.0 - 5.0 ratio    Comment:                                   T. Chol/HDL Ratio                                             Men  Women                               1/2 Avg.Risk  3.4    3.3                                   Avg.Risk  5.0    4.4                                2X Avg.Risk  9.6    7.1                                 3X Avg.Risk 23.4   11.0   CBC with Differential/Platelet     Status: None  Collection Time: 02/28/23 12:03 PM  Result Value Ref Range   WBC 4.9 3.4 - 10.8 x10E3/uL   RBC 5.16 4.14 - 5.80 x10E6/uL   Hemoglobin 14.9 13.0 - 17.7 g/dL   Hematocrit 16.1 09.6 - 51.0 %   MCV 91 79 - 97 fL   MCH 28.9 26.6 - 33.0 pg   MCHC 31.9 31.5 - 35.7 g/dL   RDW 04.5 40.9 - 81.1 %   Platelets 220 150 - 450 x10E3/uL   Neutrophils 54 Not Estab. %   Lymphs 27 Not Estab. %   Monocytes 11 Not Estab. %   Eos 7 Not Estab. %   Basos 1 Not Estab. %   Neutrophils Absolute 2.6 1.4 - 7.0 x10E3/uL   Lymphocytes Absolute 1.4 0.7 - 3.1 x10E3/uL   Monocytes Absolute 0.6 0.1 - 0.9 x10E3/uL   EOS (ABSOLUTE) 0.3 0.0 - 0.4 x10E3/uL   Basophils Absolute 0.1 0.0 - 0.2 x10E3/uL   Immature Granulocytes 0 Not Estab. %   Immature Grans (Abs) 0.0 0.0 - 0.1 x10E3/uL      Assessment & Plan:  Elevated legs when sitting. Continue same medications.  Notify cardiologist if edema worsens or if he develops SOB.  Strict diabetic diet and exercise.   Problem List Items Addressed This Visit       Cardiovascular and Mediastinum   Hypertension - Primary     Other   Hyperlipidemia   Prediabetes    Return in about 4 months (around 07/03/2023) for with fasting lab work prior.   Total time spent: 25 minutes  Google, NP  03/03/2023   This document may have been prepared by Dragon Voice Recognition software and as such may include unintentional dictation errors.

## 2023-05-18 ENCOUNTER — Other Ambulatory Visit: Payer: Self-pay | Admitting: Cardiovascular Disease

## 2023-07-04 ENCOUNTER — Encounter: Payer: Self-pay | Admitting: Cardiology

## 2023-07-04 ENCOUNTER — Ambulatory Visit (INDEPENDENT_AMBULATORY_CARE_PROVIDER_SITE_OTHER): Payer: Medicare HMO | Admitting: Cardiology

## 2023-07-04 VITALS — BP 152/68 | HR 76 | Ht 70.0 in | Wt 283.0 lb

## 2023-07-04 DIAGNOSIS — E782 Mixed hyperlipidemia: Secondary | ICD-10-CM | POA: Diagnosis not present

## 2023-07-04 DIAGNOSIS — I10 Essential (primary) hypertension: Secondary | ICD-10-CM | POA: Diagnosis not present

## 2023-07-04 DIAGNOSIS — R7303 Prediabetes: Secondary | ICD-10-CM | POA: Diagnosis not present

## 2023-07-04 NOTE — Progress Notes (Signed)
Established Patient Office Visit  Subjective:  Patient ID: [name], male    DOB: February 10, 1956  Age: 67 y.o. MRN: [id number]  Chief Complaint  Patient presents with   Follow-up    4 Months Follow Up    Patient in office for 4 month follow up, did not having fasting lab work done. A patient did not take medications this morning. Patient doing well, no acute complaints today.  Fasting labs today. Up to date on colonoscopy.     No other concerns at this time.   Past Medical History:  Diagnosis Date   Coronary artery disease    a. 12/2012 Acute inferior STEMI/PCI: occluded proximal RCA with mild LAD/left circumflex disease. Successful thrombectomy and drug-eluting stent placement to the proximal RCA. EF nl; b. 10/2013 MV: no ischemia/infarct.   Essential hypertension    Heart murmur    Hyperlipidemia    Morbid obesity (HCC)    Motion sickness    sea sick   Myocardial infarction [facility]) 12/2012   Near syncope    a. ER visit 01/2015 - ? etiology.   Nonadherence to medication    Sleep apnea    No CPAP   Stomach ulcer    Vasculitis (HCC) 2017   Treated with prednisone.   Wears dentures    Has full upper and lower.  Does not wear lower    Past Surgical History:  Procedure Laterality Date   APPENDECTOMY     CARDIAC CATHETERIZATION  6/14   [facility];EF 65%   CATARACT EXTRACTION W/PHACO Left 04/14/2022   Procedure: CATARACT EXTRACTION PHACO AND INTRAOCULAR LENS PLACEMENT (IOC) LEFT DIABETIC;  Surgeon: Lockie Mola, MD;  Location: [facility];  Service: Ophthalmology;  Laterality: Left;  sleep apnea 14.48 01:42.0   CATARACT EXTRACTION W/PHACO Right 04/28/2022   Procedure: CATARACT EXTRACTION PHACO AND INTRAOCULAR LENS PLACEMENT (IOC) RIGHT  DIABETIC 10.61 01:04.2;  Surgeon: Lockie Mola, MD;  Location: [facility];  Service: Ophthalmology;  Laterality: Right;  sleep apnea   COLONOSCOPY WITH PROPOFOL [name] 08/18/2016   Procedure: COLONOSCOPY WITH  PROPOFOL;  Surgeon: Scot Jun, MD;  Location: [facility] END[facility];  Service: Endoscopy;  Laterality: [name];   HERNIA REPAIR      Social History   Socioeconomic History   Marital status: Divorced    Spouse name: Not on file   Number of children: Not on file   Years of education: Not on file   Highest education level: Not on file  Occupational History   Not on file  Tobacco Use   Smoking status: Former    Current packs/day: 0.00    Average packs/day: 1 pack/day for 30.0 years (30.0 ttl pk-yrs)    Types: Cigarettes    Start date: 12/19/1982    Quit date: 12/18/2012    Years since quitting: 10.5   Smokeless tobacco: Never  Vaping Use   Vaping status: Never Used  Substance and Sexual Activity   Alcohol use: No    Comment: history of ETOH last drink 1998   Drug use: No    Comment: past marijuana   Sexual activity: Not on file  Other Topics Concern   Not on file  Social History Narrative   Not on file   Social Drivers of Health   Financial Resource Strain: Not on file  Food Insecurity: Not on file  Transportation Needs: Not on file  Physical Activity: Not on file  Stress: Not on file  Social Connections: Not on file  Intimate  Partner Violence: Not on file    Family History  Problem Relation Age of Onset   Heart failure Father    Heart disease Father     Allergies  Allergen Reactions   Xyzal [Levocetirizine]     Started with vasculitis after taking   Latex Rash    Mask from sleep apnea test    Outpatient Medications Prior to Visit  Medication Sig   amLODipine (NORVASC) 5 MG tablet TAKE 1 TABLET BY MOUTH DAILY   aspirin 81 MG chewable tablet Chew 81 mg by mouth daily.   carvedilol (COREG) 12.5 MG tablet TAKE 1 TABLET BY MOUTH TWICE A DAY   Cetirizine HCl (ZYRTEC PO) Take by mouth daily.   lisinopril (ZESTRIL) 40 MG tablet TAKE 1 TABLET BY MOUTH DAILY   rosuvastatin (CRESTOR) 20 MG tablet TAKE 1 TABLET BY MOUTH DAILY   vitamin D3 (CHOLECALCIFEROL) 25 MCG  tablet Take 1,000 Units by mouth daily.   No facility-administered medications prior to visit.    Review of Systems  Constitutional: Negative.   HENT: Negative.    Eyes: Negative.   Respiratory: Negative.  Negative for shortness of breath.   Cardiovascular: Negative.  Negative for chest pain.  Gastrointestinal: Negative.  Negative for abdominal pain, constipation and diarrhea.  Genitourinary: Negative.   Musculoskeletal:  Negative for joint pain and myalgias.  Skin: Negative.   Neurological: Negative.  Negative for dizziness and headaches.  Endo/Heme/Allergies: Negative.   All other systems reviewed and are negative.      Objective:   BP (!) 152/68   Pulse 76   Ht 5\' 10"  (1.778 m)   Wt 283 lb (128.4 kg)   SpO2 93%   BMI 40.61 kg/m   Vitals:   07/04/23 0916  BP: (!) 152/68  Pulse: 76  Height: 5\' 10"  (1.778 m)  Weight: 283 lb (128.4 kg)  SpO2: 93%  BMI (Calculated): 40.61    Physical Exam Nursing note reviewed.  Constitutional:      Appearance: Normal appearance. He is normal weight.  HENT:     Head: Normocephalic and atraumatic.     Nose: Nose normal.     Mouth/Throat:     Mouth: Mucous membranes are moist.     Pharynx: Oropharynx is clear.  Eyes:     Extraocular Movements: Extraocular movements intact.     Conjunctiva/sclera: Conjunctivae normal.     Pupils: Pupils are equal, round, and reactive to light.  Cardiovascular:     Rate and Rhythm: Normal rate and regular rhythm.     Pulses: Normal pulses.     Heart sounds: Normal heart sounds.  Pulmonary:     Effort: Pulmonary effort is normal.     Breath sounds: Normal breath sounds.  Abdominal:     General: Abdomen is flat. Bowel sounds are normal.     Palpations: Abdomen is soft.  Musculoskeletal:        General: Normal range of motion.     Cervical back: Normal range of motion.  Skin:    General: Skin is warm and dry.  Neurological:     General: No focal deficit present.     Mental Status: He is  alert and oriented to person, place, and time.  Psychiatric:        Mood and Affect: Mood normal.        Behavior: Behavior normal.        Thought Content: Thought content normal.        Judgment: Judgment  normal.      No results found for any visits on 07/04/23.  No results found for this or any previous visit (from the past 2160 hours).    Assessment & Plan:  Fasting lab work today Continue all medications  Problem List Items Addressed This Visit       Cardiovascular and Mediastinum   Hypertension - Primary   Relevant Orders   CMP14+EGFR   TSH   CBC with Differential/Platelet     Other   Hyperlipidemia   Relevant Orders   Lipid Profile   Prediabetes   Relevant Orders   Hemoglobin A1c   CMP14+EGFR    Return in about 4 months (around 11/02/2023) for with fasting lab work prior.   Total time spent: 25 minutes  Google, NP  07/04/2023   This document may have been prepared by Dragon Voice Recognition software and as such may include unintentional dictation errors.

## 2023-07-05 LAB — LIPID PANEL
Chol/HDL Ratio: 3.5 {ratio} (ref 0.0–5.0)
Cholesterol, Total: 126 mg/dL (ref 100–199)
HDL: 36 mg/dL — ABNORMAL LOW (ref >39)
LDL Chol Calc (NIH): 61 mg/dL (ref 0–99)
Triglycerides: 173 mg/dL — ABNORMAL HIGH (ref 0–149)
VLDL Cholesterol Cal: 29 mg/dL (ref 5–40)

## 2023-07-05 LAB — CMP14+EGFR
ALT: 39 [IU]/L (ref 0–44)
AST: 29 [IU]/L (ref 0–40)
Albumin: 4.5 g/dL (ref 3.9–4.9)
Alkaline Phosphatase: 53 [IU]/L (ref 44–121)
BUN/Creatinine Ratio: 14 (ref 10–24)
BUN: 14 mg/dL (ref 8–27)
Bilirubin Total: 1.2 mg/dL (ref 0.0–1.2)
CO2: 27 mmol/L (ref 20–29)
Calcium: 9.7 mg/dL (ref 8.6–10.2)
Chloride: 100 mmol/L (ref 96–106)
Creatinine, Ser: 1.03 mg/dL (ref 0.76–1.27)
Globulin, Total: 3.3 g/dL (ref 1.5–4.5)
Glucose: 100 mg/dL — ABNORMAL HIGH (ref 70–99)
Potassium: 4.7 mmol/L (ref 3.5–5.2)
Sodium: 142 mmol/L (ref 134–144)
Total Protein: 7.8 g/dL (ref 6.0–8.5)
eGFR: 79 mL/min/{1.73_m2} (ref >59)

## 2023-07-05 LAB — CBC WITH DIFFERENTIAL/PLATELET
Basophils Absolute: 0.1 10*3/uL (ref 0.0–0.2)
Basos: 1 %
EOS (ABSOLUTE): 0.3 10*3/uL (ref 0.0–0.4)
Eos: 6 %
Hematocrit: 45.9 % (ref 37.5–51.0)
Hemoglobin: 15.3 g/dL (ref 13.0–17.7)
Immature Grans (Abs): 0 10*3/uL (ref 0.0–0.1)
Immature Granulocytes: 0 %
Lymphocytes Absolute: 1.2 10*3/uL (ref 0.7–3.1)
Lymphs: 21 %
MCH: 29.5 pg (ref 26.6–33.0)
MCHC: 33.3 g/dL (ref 31.5–35.7)
MCV: 89 fL (ref 79–97)
Monocytes Absolute: 0.8 10*3/uL (ref 0.1–0.9)
Monocytes: 14 %
Neutrophils Absolute: 3.2 10*3/uL (ref 1.4–7.0)
Neutrophils: 58 %
Platelets: 235 10*3/uL (ref 150–450)
RBC: 5.18 x10E6/uL (ref 4.14–5.80)
RDW: 13.1 % (ref 11.6–15.4)
WBC: 5.5 10*3/uL (ref 3.4–10.8)

## 2023-07-05 LAB — HEMOGLOBIN A1C
Est. average glucose Bld gHb Est-mCnc: 134 mg/dL
Hgb A1c MFr Bld: 6.3 % — ABNORMAL HIGH (ref 4.8–5.6)

## 2023-07-05 LAB — TSH: TSH: 1.34 u[IU]/mL (ref 0.450–4.500)

## 2023-07-07 ENCOUNTER — Telehealth: Payer: Self-pay

## 2023-07-07 NOTE — Telephone Encounter (Signed)
 Patient [name] stating he's got a sinus infection, he was just seen Monday does he need to make another appt or do you want to call him in something

## 2023-07-08 ENCOUNTER — Other Ambulatory Visit: Payer: Self-pay | Admitting: Cardiovascular Disease

## 2023-08-08 ENCOUNTER — Encounter: Payer: Self-pay | Admitting: Internal Medicine

## 2023-08-08 ENCOUNTER — Other Ambulatory Visit: Payer: Self-pay | Admitting: Internal Medicine

## 2023-08-08 ENCOUNTER — Ambulatory Visit (INDEPENDENT_AMBULATORY_CARE_PROVIDER_SITE_OTHER): Payer: Medicare HMO | Admitting: Internal Medicine

## 2023-08-08 VITALS — BP 150/60 | HR 73 | Ht 70.0 in | Wt 279.0 lb

## 2023-08-08 DIAGNOSIS — R7303 Prediabetes: Secondary | ICD-10-CM

## 2023-08-08 DIAGNOSIS — I10 Essential (primary) hypertension: Secondary | ICD-10-CM

## 2023-08-08 DIAGNOSIS — B0229 Other postherpetic nervous system involvement: Secondary | ICD-10-CM

## 2023-08-08 DIAGNOSIS — B029 Zoster without complications: Secondary | ICD-10-CM | POA: Diagnosis not present

## 2023-08-08 MED ORDER — GABAPENTIN 100 MG PO CAPS: 100.0000 mg | ORAL_CAPSULE | Freq: Two times a day (BID) | ORAL | 2 refills | Status: DC

## 2023-08-08 NOTE — Progress Notes (Signed)
Established Patient Office Visit  Subjective:  Patient ID: [name], male    DOB: 01/21/1956  Age: 68 y.o. MRN: [id number]  Chief Complaint  Patient presents with   Rash    Right side and back    Patient comes in for follow-up of his shingles.  Patient called in after hours, 2 days ago reporting of a painful burning rash along the right thoracic dermatomes, which had just started.  He did have a history of chickenpox as a child.  There was a strong suspicion of herpes zoster, and patient was prescribed Valtrex  1000 mg 3 times daily.  Today he feels a little better.  There are scattered groups of  vesicles, and reddish papules spread along T3 and T4  dermatomes ,on the right side with intermittent burning pain and mild itching.  Will also add gabapentin 100 mg to be taken once or twice a day.  No fevers or chills, no headaches or dizziness and no cough or shortness of breath reported.    No other concerns at this time.   Past Medical History:  Diagnosis Date   Coronary artery disease    a. 12/2012 Acute inferior STEMI/PCI: occluded proximal RCA with mild LAD/left circumflex disease. Successful thrombectomy and drug-eluting stent placement to the proximal RCA. EF nl; b. 10/2013 MV: no ischemia/infarct.   Essential hypertension    Heart murmur    Hyperlipidemia    Morbid obesity (HCC)    Motion sickness    sea sick   Myocardial infarction [facility]) 12/2012   Near syncope    a. ER visit 01/2015 - ? etiology.   Nonadherence to medication    Sleep apnea    No CPAP   Stomach ulcer    Vasculitis (HCC) 2017   Treated with prednisone.   Wears dentures    Has full upper and lower.  Does not wear lower    Past Surgical History:  Procedure Laterality Date   APPENDECTOMY     CARDIAC CATHETERIZATION  6/14   [facility];EF 65%   CATARACT EXTRACTION W/PHACO Left 04/14/2022   Procedure: CATARACT EXTRACTION PHACO AND INTRAOCULAR LENS PLACEMENT (IOC) LEFT DIABETIC;  Surgeon: Lockie Mola, MD;  Location: [facility];  Service: Ophthalmology;  Laterality: Left;  sleep apnea 14.48 01:42.0   CATARACT EXTRACTION W/PHACO Right 04/28/2022   Procedure: CATARACT EXTRACTION PHACO AND INTRAOCULAR LENS PLACEMENT (IOC) RIGHT  DIABETIC 10.61 01:04.2;  Surgeon: Lockie Mola, MD;  Location: [facility];  Service: Ophthalmology;  Laterality: Right;  sleep apnea   COLONOSCOPY WITH PROPOFOL [name] 08/18/2016   Procedure: COLONOSCOPY WITH PROPOFOL;  Surgeon: Scot Jun, MD;  Location: [facility] ENDOSCOPY;  Service: Endoscopy;  Laterality: [name];   HERNIA REPAIR      Social History   Socioeconomic History   Marital status: Divorced    Spouse name: Not on file   Number of children: Not on file   Years of education: Not on file   Highest education level: Not on file  Occupational History   Not on file  Tobacco Use   Smoking status: Former    Current packs/day: 0.00    Average packs/day: 1 pack/day for 30.0 years (30.0 ttl pk-yrs)    Types: Cigarettes    Start date: 12/19/1982    Quit date: 12/18/2012    Years since quitting: 10.6   Smokeless tobacco: Never  Vaping Use   Vaping status: Never Used  Substance and Sexual Activity   Alcohol use: No  Comment: history of ETOH last drink 1998   Drug use: No    Comment: past marijuana   Sexual activity: Not on file  Other Topics Concern   Not on file  Social History Narrative   Not on file   Social Drivers of Health   Financial Resource Strain: Not on file  Food Insecurity: Not on file  Transportation Needs: Not on file  Physical Activity: Not on file  Stress: Not on file  Social Connections: Not on file  Intimate Partner Violence: Not on file    Family History  Problem Relation Age of Onset   Heart failure Father    Heart disease Father     Allergies  Allergen Reactions   Xyzal [Levocetirizine]     Started with vasculitis after taking   Latex Rash    Mask from sleep apnea test     Outpatient Medications Prior to Visit  Medication Sig   amLODipine (NORVASC) 5 MG tablet TAKE 1 TABLET BY MOUTH DAILY   aspirin 81 MG chewable tablet Chew 81 mg by mouth daily.   carvedilol (COREG) 12.5 MG tablet TAKE 1 TABLET BY MOUTH TWICE A DAY   lisinopril (ZESTRIL) 40 MG tablet TAKE 1 TABLET BY MOUTH DAILY   rosuvastatin (CRESTOR) 20 MG tablet TAKE 1 TABLET BY MOUTH DAILY   valACYclovir (VALTREX) 1000 MG tablet Take 1,000 mg by mouth 3 (three) times daily.   vitamin D3 (CHOLECALCIFEROL) 25 MCG tablet Take 1,000 Units by mouth daily.   Cetirizine HCl (ZYRTEC PO) Take by mouth daily. (Patient not taking: Reported on 08/08/2023)   No facility-administered medications prior to visit.    Review of Systems  Constitutional:  Positive for malaise/fatigue. Negative for chills, fever and weight loss.  HENT: Negative.  Negative for sinus pain and sore throat.   Eyes: Negative.   Respiratory: Negative.  Negative for cough and shortness of breath.   Cardiovascular: Negative.  Negative for chest pain, palpitations and leg swelling.  Gastrointestinal: Negative.  Negative for abdominal pain, constipation, diarrhea, heartburn, nausea and vomiting.  Genitourinary: Negative.  Negative for dysuria and flank pain.  Musculoskeletal: Negative.  Negative for joint pain and myalgias.  Skin:  Positive for rash.  Neurological: Negative.  Negative for dizziness, tingling, tremors, sensory change, speech change and headaches.  Endo/Heme/Allergies: Negative.   Psychiatric/Behavioral: Negative.  Negative for depression and suicidal ideas. The patient is not nervous/anxious.        Objective:   BP (!) 150/60   Pulse 73   Ht 5\' 10"  (1.778 m)   Wt 279 lb (126.6 kg)   SpO2 98%   BMI 40.03 kg/m   Vitals:   08/08/23 0851  BP: (!) 150/60  Pulse: 73  Height: 5\' 10"  (1.778 m)  Weight: 279 lb (126.6 kg)  SpO2: 98%  BMI (Calculated): 40.03    Physical Exam Vitals and nursing note reviewed.   Constitutional:      Appearance: Normal appearance.  HENT:     Head: Normocephalic and atraumatic.     Nose: Nose normal.     Mouth/Throat:     Mouth: Mucous membranes are moist.     Pharynx: Oropharynx is clear.  Eyes:     Conjunctiva/sclera: Conjunctivae normal.     Pupils: Pupils are equal, round, and reactive to light.  Cardiovascular:     Rate and Rhythm: Normal rate and regular rhythm.     Pulses: Normal pulses.     Heart sounds: Normal heart sounds.  Pulmonary:     Effort: Pulmonary effort is normal.     Breath sounds: Normal breath sounds.  Abdominal:     General: Bowel sounds are normal.     Palpations: Abdomen is soft.  Musculoskeletal:        General: Normal range of motion.     Cervical back: Normal range of motion.  Skin:    General: Skin is warm and dry.     Findings: Rash (shingles on right thoracic dermatomes.) present.  Neurological:     General: No focal deficit present.     Mental Status: He is alert and oriented to person, place, and time.  Psychiatric:        Mood and Affect: Mood normal.        Behavior: Behavior normal.        Judgment: Judgment normal.      No results found for any visits on 08/08/23.  Recent Results (from the past 2160 hours)  Lipid Profile     Status: Abnormal   Collection Time: 07/04/23  9:32 AM  Result Value Ref Range   Cholesterol, Total 126 100 - 199 mg/dL   Triglycerides 657 (H) 0 - 149 mg/dL   HDL 36 (L) >84 mg/dL   VLDL Cholesterol Cal 29 5 - 40 mg/dL   LDL Chol Calc (NIH) 61 0 - 99 mg/dL   Chol/HDL Ratio 3.5 0.0 - 5.0 ratio    Comment:                                   T. Chol/HDL Ratio                                             Men  Women                               1/2 Avg.Risk  3.4    3.3                                   Avg.Risk  5.0    4.4                                2X Avg.Risk  9.6    7.1                                3X Avg.Risk 23.4   11.0   Hemoglobin A1c     Status: Abnormal   Collection  Time: 07/04/23  9:32 AM  Result Value Ref Range   Hgb A1c MFr Bld 6.3 (H) 4.8 - 5.6 %    Comment:          Prediabetes: 5.7 - 6.4          Diabetes: >6.4          Glycemic control for adults with diabetes: <7.0    Est. average glucose Bld gHb Est-mCnc 134 mg/dL  [id number]+BMWU     Status: Abnormal   Collection Time: 07/04/23  9:32 AM  Result Value Ref Range  Glucose 100 (H) 70 - 99 mg/dL   BUN 14 8 - 27 mg/dL   Creatinine, Ser 1.32 0.76 - 1.27 mg/dL   eGFR 79 >44 WN/UUV/2.53   BUN/Creatinine Ratio 14 10 - 24   Sodium 142 134 - 144 mmol/L   Potassium 4.7 3.5 - 5.2 mmol/L   Chloride 100 96 - 106 mmol/L   CO2 27 20 - 29 mmol/L   Calcium 9.7 8.6 - 10.2 mg/dL   Total Protein 7.8 6.0 - 8.5 g/dL   Albumin 4.5 3.9 - 4.9 g/dL   Globulin, Total 3.3 1.5 - 4.5 g/dL   Bilirubin Total 1.2 0.0 - 1.2 mg/dL   Alkaline Phosphatase 53 44 - 121 IU/L   AST 29 0 - 40 IU/L   ALT 39 0 - 44 IU/L  TSH     Status: None   Collection Time: 07/04/23  9:32 AM  Result Value Ref Range   TSH 1.340 0.450 - 4.500 uIU/mL  CBC with Differential/Platelet     Status: None   Collection Time: 07/04/23  9:32 AM  Result Value Ref Range   WBC 5.5 3.4 - 10.8 x10E3/uL   RBC 5.18 4.14 - 5.80 x10E6/uL   Hemoglobin 15.3 13.0 - 17.7 g/dL   Hematocrit 66.4 40.3 - 51.0 %   MCV 89 79 - 97 fL   MCH 29.5 26.6 - 33.0 pg   MCHC 33.3 31.5 - 35.7 g/dL   RDW 47.4 25.9 - 56.3 %   Platelets 235 150 - 450 x10E3/uL   Neutrophils 58 Not Estab. %   Lymphs 21 Not Estab. %   Monocytes 14 Not Estab. %   Eos 6 Not Estab. %   Basos 1 Not Estab. %   Neutrophils Absolute 3.2 1.4 - 7.0 x10E3/uL   Lymphocytes Absolute 1.2 0.7 - 3.1 x10E3/uL   Monocytes Absolute 0.8 0.1 - 0.9 x10E3/uL   EOS (ABSOLUTE) 0.3 0.0 - 0.4 x10E3/uL   Basophils Absolute 0.1 0.0 - 0.2 x10E3/uL   Immature Granulocytes 0 Not Estab. %   Immature Grans (Abs) 0.0 0.0 - 0.1 x10E3/uL      Assessment & Plan:  Patient advised to continue Valtrex until finished.  Will add  gabapentin to be taken at night and in the morning as needed.  Monitor blood pressure.  Follow-up to check on the rash. Problem List Items Addressed This Visit     Essential hypertension   Prediabetes   Other Visit Diagnoses       Herpes zoster without complication    -  Primary   Relevant Medications   valACYclovir (VALTREX) 1000 MG tablet       Return in about 2 weeks (around 08/22/2023).   Total time spent:  Margaretann Loveless, MD  08/08/2023   This document may have been prepared by [facility] Voice Recognition software and as such may include unintentional dictation errors.

## 2023-08-15 ENCOUNTER — Other Ambulatory Visit: Payer: Self-pay | Admitting: Cardiology

## 2023-08-15 ENCOUNTER — Other Ambulatory Visit: Payer: Self-pay

## 2023-08-15 ENCOUNTER — Telehealth: Payer: Self-pay | Admitting: Cardiology

## 2023-08-15 MED ORDER — PAXLOVID (300/100) 20 X 150 MG & 10 X 100MG PO TBPK: 3.0000 | ORAL_TABLET | Freq: Two times a day (BID) | ORAL | 0 refills | Status: AC

## 2023-08-15 MED ORDER — PAXLOVID (300/100) 20 X 150 MG & 10 X 100MG PO TBPK: 3.0000 | ORAL_TABLET | Freq: Two times a day (BID) | ORAL | 0 refills | Status: DC

## 2023-08-15 NOTE — Progress Notes (Signed)
 [name]

## 2023-08-15 NOTE — Telephone Encounter (Signed)
 Patient called stating that his wife has covid and he has taken an @home  test & it was positive. Can you please send pt a rx to [facility].

## 2023-08-22 ENCOUNTER — Encounter: Payer: Self-pay | Admitting: Cardiology

## 2023-08-22 ENCOUNTER — Ambulatory Visit (INDEPENDENT_AMBULATORY_CARE_PROVIDER_SITE_OTHER): Payer: Medicare HMO | Admitting: Cardiology

## 2023-08-22 VITALS — BP 138/70 | HR 83 | Ht 70.0 in | Wt 270.0 lb

## 2023-08-22 DIAGNOSIS — Z0130 Encounter for examination of blood pressure without abnormal findings: Secondary | ICD-10-CM

## 2023-08-22 DIAGNOSIS — B029 Zoster without complications: Secondary | ICD-10-CM | POA: Insufficient documentation

## 2023-08-22 DIAGNOSIS — B0229 Other postherpetic nervous system involvement: Secondary | ICD-10-CM | POA: Insufficient documentation

## 2023-08-22 NOTE — Progress Notes (Signed)
 Established Patient Office Visit  Subjective:  Patient ID: [name], male    DOB: [birth year]  Age: 68 y.o. MRN: [id number]  Chief Complaint  Patient presents with   Follow-up    2 Weeks Follow Up    Patient in office for 2 week follow up. Patient seen on 2/3 for painful burning rash along the right thoracic dermatomes. Patient treated with Valtrex and Gabapentin. Patient presents today, states pain is mostly gone. Has finished Valtrex. Continues to take Gabapentin as prescribed.  Blood pressure improved on recheck. States he did not take his morning medications.     No other concerns at this time.   Past Medical History:  Diagnosis Date   Coronary artery disease    a. 12/2012 Acute inferior STEMI/PCI: occluded proximal RCA with mild LAD/left circumflex disease. Successful thrombectomy and drug-eluting stent placement to the proximal RCA. EF nl; b. 10/2013 MV: no ischemia/infarct.   Essential hypertension    Heart murmur    Hyperlipidemia    Morbid obesity (HCC)    Motion sickness    sea sick   Myocardial infarction [facility]) 12/2012   Near syncope    a. ER visit 01/2015 - ? etiology.   Nonadherence to medication    Sleep apnea    No CPAP   Stomach ulcer    Vasculitis (HCC) 2017   Treated with prednisone.   Wears dentures    Has full upper and lower.  Does not wear lower    Past Surgical History:  Procedure Laterality Date   APPENDECTOMY     CARDIAC CATHETERIZATION  6/14   [facility];EF 65%   CATARACT EXTRACTION W/PHACO Left 04/14/2022   Procedure: CATARACT EXTRACTION PHACO AND INTRAOCULAR LENS PLACEMENT (IOC) LEFT DIABETIC;  Surgeon: Lockie Mola, MD;  Location: [facility];  Service: Ophthalmology;  Laterality: Left;  sleep apnea 14.48 01:42.0   CATARACT EXTRACTION W/PHACO Right 04/28/2022   Procedure: CATARACT EXTRACTION PHACO AND INTRAOCULAR LENS PLACEMENT (IOC) RIGHT  DIABETIC 10.61 01:04.2;  Surgeon: Lockie Mola, MD;  Location: [facility]  SURGERY [facility];  Service: Ophthalmology;  Laterality: Right;  sleep apnea   COLONOSCOPY WITH PROPOFOL [name] 08/18/2016   Procedure: COLONOSCOPY WITH PROPOFOL;  Surgeon: Scot Jun, MD;  Location: [facility] ENDOSCOPY;  Service: Endoscopy;  Laterality: [name];   HERNIA REPAIR      Social History   Socioeconomic History   Marital status: Divorced    Spouse name: Not on file   Number of children: Not on file   Years of education: Not on file   Highest education level: Not on file  Occupational History   Not on file  Tobacco Use   Smoking status: Former    Current packs/day: 0.00    Average packs/day: 1 pack/day for 30.0 years (30.0 ttl pk-yrs)    Types: Cigarettes    Start date: 12/19/1982    Quit date: 12/18/2012    Years since quitting: 10.6   Smokeless tobacco: Never  Vaping Use   Vaping status: Never Used  Substance and Sexual Activity   Alcohol use: No    Comment: history of ETOH last drink 1998   Drug use: No    Comment: past marijuana   Sexual activity: Not on file  Other Topics Concern   Not on file  Social History Narrative   Not on file   Social Drivers of Health   Financial Resource Strain: Not on file  Food Insecurity: Not on file  Transportation Needs: Not on file  Physical Activity: Not on file  Stress: Not on file  Social Connections: Not on file  Intimate Partner Violence: Not on file    Family History  Problem Relation Age of Onset   Heart failure Father    Heart disease Father     Allergies  Allergen Reactions   Xyzal [Levocetirizine]     Started with vasculitis after taking   Latex Rash    Mask from sleep apnea test    Outpatient Medications Prior to Visit  Medication Sig   amLODipine (NORVASC) 5 MG tablet TAKE 1 TABLET BY MOUTH DAILY   aspirin 81 MG chewable tablet Chew 81 mg by mouth daily.   carvedilol (COREG) 12.5 MG tablet TAKE 1 TABLET BY MOUTH TWICE A DAY   Cetirizine HCl (ZYRTEC PO) Take by mouth daily. (Patient not taking: Reported on  08/08/2023)   gabapentin (NEURONTIN) 100 MG capsule Take 1 capsule (100 mg total) by mouth 2 (two) times daily.   lisinopril (ZESTRIL) 40 MG tablet TAKE 1 TABLET BY MOUTH DAILY   rosuvastatin (CRESTOR) 20 MG tablet TAKE 1 TABLET BY MOUTH DAILY   valACYclovir (VALTREX) 1000 MG tablet Take 1,000 mg by mouth 3 (three) times daily.   vitamin D3 (CHOLECALCIFEROL) 25 MCG tablet Take 1,000 Units by mouth daily.   No facility-administered medications prior to visit.    Review of Systems  Constitutional: Negative.   HENT: Negative.    Eyes: Negative.   Respiratory: Negative.  Negative for shortness of breath.   Cardiovascular: Negative.  Negative for chest pain.  Gastrointestinal: Negative.  Negative for abdominal pain, constipation and diarrhea.  Genitourinary: Negative.   Musculoskeletal:  Negative for joint pain and myalgias.  Skin: Negative.   Neurological: Negative.  Negative for dizziness and headaches.  Endo/Heme/Allergies: Negative.   All other systems reviewed and are negative.      Objective:   BP 138/70 (BP Location: Right Arm, Patient Position: Sitting, Cuff Size: Large)   Pulse 83   Ht 5\' 10"  (1.778 m)   Wt 270 lb (122.5 kg)   SpO2 93%   BMI 38.74 kg/m   Vitals:   08/22/23 1049 08/22/23 1107  BP: (!) 152/78 138/70  Pulse: 83   Height: 5\' 10"  (1.778 m)   Weight: 270 lb (122.5 kg)   SpO2: 93%   BMI (Calculated): 38.74     Physical Exam Nursing note reviewed.  Constitutional:      Appearance: Normal appearance. He is normal weight.  HENT:     Head: Normocephalic and atraumatic.     Nose: Nose normal.     Mouth/Throat:     Mouth: Mucous membranes are moist.     Pharynx: Oropharynx is clear.  Eyes:     Extraocular Movements: Extraocular movements intact.     Conjunctiva/sclera: Conjunctivae normal.     Pupils: Pupils are equal, round, and reactive to light.  Cardiovascular:     Rate and Rhythm: Normal rate and regular rhythm.     Pulses: Normal pulses.      Heart sounds: Normal heart sounds.  Pulmonary:     Effort: Pulmonary effort is normal.     Breath sounds: Normal breath sounds.  Abdominal:     General: Abdomen is flat. Bowel sounds are normal.     Palpations: Abdomen is soft.  Musculoskeletal:        General: Normal range of motion.     Cervical back: Normal range of motion.  Skin:    General: Skin  is warm and dry.  Neurological:     General: No focal deficit present.     Mental Status: He is alert and oriented to person, place, and time.  Psychiatric:        Mood and Affect: Mood normal.        Behavior: Behavior normal.        Thought Content: Thought content normal.        Judgment: Judgment normal.      No results found for any visits on 08/22/23.  Recent Results (from the past 2160 hours)  Lipid Profile     Status: Abnormal   Collection Time: 07/04/23  9:32 AM  Result Value Ref Range   Cholesterol, Total 126 100 - 199 mg/dL   Triglycerides 130 (H) 0 - 149 mg/dL   HDL 36 (L) >86 mg/dL   VLDL Cholesterol Cal 29 5 - 40 mg/dL   LDL Chol Calc (NIH) 61 0 - 99 mg/dL   Chol/HDL Ratio 3.5 0.0 - 5.0 ratio    Comment:                                   T. Chol/HDL Ratio                                             Men  Women                               1/2 Avg.Risk  3.4    3.3                                   Avg.Risk  5.0    4.4                                2X Avg.Risk  9.6    7.1                                3X Avg.Risk 23.4   11.0   Hemoglobin A1c     Status: Abnormal   Collection Time: 07/04/23  9:32 AM  Result Value Ref Range   Hgb A1c MFr Bld 6.3 (H) 4.8 - 5.6 %    Comment:          Prediabetes: 5.7 - 6.4          Diabetes: >6.4          Glycemic control for adults with diabetes: <7.0    Est. average glucose Bld gHb Est-mCnc 134 mg/dL  [id number]     Status: Abnormal   Collection Time: 07/04/23  9:32 AM  Result Value Ref Range   Glucose 100 (H) 70 - 99 mg/dL   BUN 14 8 - 27 mg/dL   Creatinine, Ser 5.28  0.76 - 1.27 mg/dL   eGFR 79 >41 LK/GMW/1.02   BUN/Creatinine Ratio 14 10 - 24   Sodium 142 134 - 144 mmol/L   Potassium 4.7 3.5 - 5.2 mmol/L   Chloride 100 96 - 106 mmol/L   CO2 27 20 -  29 mmol/L   Calcium 9.7 8.6 - 10.2 mg/dL   Total Protein 7.8 6.0 - 8.5 g/dL   Albumin 4.5 3.9 - 4.9 g/dL   Globulin, Total 3.3 1.5 - 4.5 g/dL   Bilirubin Total 1.2 0.0 - 1.2 mg/dL   Alkaline Phosphatase 53 44 - 121 IU/L   AST 29 0 - 40 IU/L   ALT 39 0 - 44 IU/L  TSH     Status: None   Collection Time: 07/04/23  9:32 AM  Result Value Ref Range   TSH 1.340 0.450 - 4.500 uIU/mL  CBC with Differential/Platelet     Status: None   Collection Time: 07/04/23  9:32 AM  Result Value Ref Range   WBC 5.5 3.4 - 10.8 x10E3/uL   RBC 5.18 4.14 - 5.80 x10E6/uL   Hemoglobin 15.3 13.0 - 17.7 g/dL   Hematocrit 40.9 81.1 - 51.0 %   MCV 89 79 - 97 fL   MCH 29.5 26.6 - 33.0 pg   MCHC 33.3 31.5 - 35.7 g/dL   RDW 91.4 78.2 - 95.6 %   Platelets 235 150 - 450 x10E3/uL   Neutrophils 58 Not Estab. %   Lymphs 21 Not Estab. %   Monocytes 14 Not Estab. %   Eos 6 Not Estab. %   Basos 1 Not Estab. %   Neutrophils Absolute 3.2 1.4 - 7.0 x10E3/uL   Lymphocytes Absolute 1.2 0.7 - 3.1 x10E3/uL   Monocytes Absolute 0.8 0.1 - 0.9 x10E3/uL   EOS (ABSOLUTE) 0.3 0.0 - 0.4 x10E3/uL   Basophils Absolute 0.1 0.0 - 0.2 x10E3/uL   Immature Granulocytes 0 Not Estab. %   Immature Grans (Abs) 0.0 0.0 - 0.1 x10E3/uL      Assessment & Plan:  Continue medications as prescribed.   Problem List Items Addressed This Visit       Nervous and Auditory   Post herpetic neuralgia     Other   Herpes zoster without complication - Primary    Return if symptoms worsen or fail to improve.   Total time spent: 25 minutes  [employer], NP  08/22/2023   This document may have been prepared by Dragon Voice Recognition software and as such may include unintentional dictation errors.

## 2023-08-26 ENCOUNTER — Other Ambulatory Visit: Payer: Self-pay

## 2023-08-26 MED ORDER — AMLODIPINE BESYLATE 5 MG PO TABS: 5.0000 mg | ORAL_TABLET | Freq: Every day | ORAL | 0 refills | Status: DC

## 2023-10-12 ENCOUNTER — Other Ambulatory Visit: Payer: Self-pay | Admitting: Cardiovascular Disease

## 2023-10-12 NOTE — Telephone Encounter (Signed)
 Hi,  Could you schedule this patient a 12 month follow up visit?  The patient was last seen by Dr. Kirke Corin on 10-21-2022. Thank you so much.

## 2023-10-13 ENCOUNTER — Telehealth: Payer: Self-pay | Admitting: Cardiovascular Disease

## 2023-10-13 MED ORDER — LISINOPRIL 40 MG PO TABS: 40.0000 mg | ORAL_TABLET | Freq: Every day | ORAL | 0 refills | Status: DC

## 2023-10-13 NOTE — Telephone Encounter (Signed)
*  STAT* If patient is at the pharmacy, call can be transferred to refill team.   1. Which medications need to be refilled? (please list name of each medication and dose if known) lisinopril (ZESTRIL) 40 MG tablet   2. Which pharmacy/location (including street and city if local pharmacy) is medication to be sent to?  [facility] - [place], [place] - [address]    3. Do they need a 30 day or 90 day supply? 90

## 2023-10-19 ENCOUNTER — Other Ambulatory Visit: Payer: Self-pay

## 2023-10-19 MED ORDER — LISINOPRIL 40 MG PO TABS: 40.0000 mg | ORAL_TABLET | Freq: Every day | ORAL | 0 refills | Status: DC

## 2023-10-25 ENCOUNTER — Ambulatory Visit: Attending: Cardiology | Admitting: Cardiology

## 2023-10-25 ENCOUNTER — Encounter: Payer: Self-pay | Admitting: Cardiology

## 2023-10-25 VITALS — BP 154/82 | HR 80 | Ht 70.5 in | Wt 281.0 lb

## 2023-10-25 DIAGNOSIS — R011 Cardiac murmur, unspecified: Secondary | ICD-10-CM | POA: Diagnosis not present

## 2023-10-25 DIAGNOSIS — E785 Hyperlipidemia, unspecified: Secondary | ICD-10-CM

## 2023-10-25 DIAGNOSIS — E669 Obesity, unspecified: Secondary | ICD-10-CM

## 2023-10-25 DIAGNOSIS — R072 Precordial pain: Secondary | ICD-10-CM

## 2023-10-25 DIAGNOSIS — I10 Essential (primary) hypertension: Secondary | ICD-10-CM | POA: Diagnosis not present

## 2023-10-25 DIAGNOSIS — I2510 Atherosclerotic heart disease of native coronary artery without angina pectoris: Secondary | ICD-10-CM

## 2023-10-25 MED ORDER — HYDROCHLOROTHIAZIDE 12.5 MG PO CAPS: 12.5000 mg | ORAL_CAPSULE | Freq: Every day | ORAL | 3 refills | Status: AC

## 2023-10-25 NOTE — Progress Notes (Signed)
 Cardiology Office Note:  .   Date:  10/25/2023  ID:  [name], DOB [birth year], MRN [id number] PCP: Alica Antu, NP   HeartCare Providers Cardiologist:  Antionette Kirks, MD    History of Present Illness: .   [name] is a 68 y.o. male with a past medical history of coronary artery disease, essential hypertension, hyperlipidemia, type 2 diabetes, obesity, who is here today to follow up on his coronary artery disease.    He initial presented 12/2012 with acute inferior ST elevation myocardial infarction. Cardiac catheterization showed an occluded proximal RCA with mild disease to the left circumflex and LAD. He underwent successful thrombectomy and DES placement to the RCA without complications.  Ejection fraction was found to be normal on echocardiogram.  He had exertional chest pain in April 2015 and underwent nuclear stress testing which showed no evidence of ischemia and normal ejection fraction.  Echocardiogram done in October 2022 showed normal LV systolic function and no significant valvular abnormalities.   He was last seen in clinic 10/21/2022 by Dr.Arida.  At that time he been doing well with no chest pain, shortness of breath or palpitations.  No changes were made to current medication regimen and no further testing was ordered at that time.  He returns to clinic today stating that he recently suffered from a mild shingles outbreak followed by COVID infection where he had increased joints, sore throat, and weakness.  He states shingles were treated with gabapentin  and valacyclovir.  He occasionally states at times he has some discomfort in his chest but he is unsure whether it could be gas or actual chest discomfort.  States that he often feels as though he loses circulation in his arm when he lies down in the bed.  States that he continues to have a lower extremity edema but does also note high sodium diet.  States that he has been compliant with his current medication  regimen without adverse side effects.  Denies any hospitalizations or visits to the emergency department.  ROS: Review of systems has been reviewed and considered negative with exception was been listed in the HPI  Studies Reviewed: Aaron Aas   EKG Interpretation Date/Time:  Tuesday October 25 2023 08:24:03 EDT Ventricular Rate:  80 PR Interval:  176 QRS Duration:  88 QT Interval:  368 QTC Calculation: 424 R Axis:   -43  Text Interpretation: Normal sinus rhythm Left axis deviation Nonspecific T wave abnormality When compared with ECG of 06-Dec-2015 09:16, Confirmed by [name] ([id number]) on 10/25/2023 8:28:56 AM    2D echo 04/07/2021 1. Left ventricular ejection fraction, by estimation, is 60 to 65%. The  left ventricle has normal function. The left ventricle has no regional  wall motion abnormalities. Left ventricular diastolic parameters are  consistent with Grade II diastolic  dysfunction (pseudonormalization). Elevated left atrial pressure.   2. Right ventricular systolic function is normal. The right ventricular  size is mildly enlarged.   3. Left atrial size was mildly dilated.   4. Right atrial size was mild to moderately dilated.   5. The mitral valve is abnormal. Trivial mitral valve regurgitation. No  evidence of mitral stenosis.   6. The aortic valve was not well visualized. Aortic valve regurgitation  is not visualized. No aortic stenosis is present.   7. The inferior vena cava is normal in size with <50% respiratory  variability, suggesting right atrial pressure of 8 mmHg.   Risk Assessment/Calculations:     HYPERTENSION CONTROL Vitals:  10/25/23 0816 10/25/23 0850  BP: (!) 152/78 (!) 154/82    The patient's blood pressure is elevated above target today.  In order to address the patient's elevated BP: A new medication was prescribed today.          Physical Exam:   VS:  BP (!) 154/82 (BP Location: Left Arm, Patient Position: Sitting, Cuff Size: Large)   Pulse 80    Ht 5' 10.5" (1.791 m)   Wt 281 lb (127.5 kg)   SpO2 95%   BMI 39.75 kg/m    Wt Readings from Last 3 Encounters:  10/25/23 281 lb (127.5 kg)  08/22/23 270 lb (122.5 kg)  08/08/23 279 lb (126.6 kg)    GEN: Well nourished, well developed in no acute distress NECK: No JVD; No carotid bruits CARDIAC: RRR, II/VI systolic murmur without rubs or gallops RESPIRATORY:  Clear to auscultation without rales, wheezing or rhonchi  ABDOMEN: Soft, non-tender, obese, non-distended EXTREMITIES:  2+ pitting edema; No deformity   ASSESSMENT AND PLAN: .   Coronary artery disease of on the native coronary arteries with occasional discomfort where he has improved his gas pain or if it is actual chest pain as they both mimic the same for him.  Previous acute inferior ST elevation myocardial infarction 12/2012 cardiac catheterization showed occluded proximal RCA with mild disease in the left circumflex and LAD.  He underwent successful thrombectomy and DES placement to the RCA without complications.  Last nuclear stress testing completed in April 2025 showed no evidence of ischemia with a normal ejection fraction.  Blood pressure has been suboptimally controlled.  Recent shingles outbreak and COVID infection.  He has been scheduled for updated stress testing with a cardiac PET stress for atypical chest pain.  EKG today reveals sinus rhythm with a rate of 80 with left axis deviation and nonspecific T wave abnormality noted in the lateral leads concerning for ischemia.  He has continued on aspirin  81 mg daily and rosuvastatin  20 mg daily.  Primary hypertension with a blood pressure today 152/78.  Blood pressure continues to be suboptimally controlled.  Looking back at further chart review even several visits since last PCP blood pressure has been elevated.  He is continued on amlodipine  5 mg daily, carvedilol  12.5 mg twice daily, and lisinopril  40 mg daily.  With continued elevated blood pressures would not escalate to  Norvasc  due to peripheral edema.  Will start him on HCTZ 12.5 mg daily.  He will need BMP in 2 weeks.  He has been encouraged to continue to monitor his pressure 1 to 2 hours postmedication administration as well.  Mixed hyperlipidemia with last LDL at 61 which is remained at goal less than 70.  He is continued on rosuvastatin  20 mg daily.  Heart murmur is unchanged from previous examinations.  Last echocardiogram completed in October 2022 showed no significant stenosis.  Will continue with surveillance studies.  Obesity with BMI 30.7.  It would be beneficial for weight loss.  Please 11 pounds since the last visit with his PCP in February of this year.  Encouraged more heart healthy diet.    Informed Consent   Shared Decision Making/Informed Consent The risks [chest pain, shortness of breath, cardiac arrhythmias, dizziness, blood pressure fluctuations, myocardial infarction, stroke/transient ischemic attack, nausea, vomiting, allergic reaction, radiation exposure, metallic taste sensation and life-threatening complications (estimated to be 1 in 10,000)], benefits (risk stratification, diagnosing coronary artery disease, treatment guidance) and alternatives of a cardiac PET stress test were discussed in  detail with Mr. Bekker and he agrees to proceed.     Dispo: Patient return to clinic with MD/APP in 6 weeks or sooner if needed for reevaluation of symptoms after testing has been completed.  Signed, Hava Massingale, NP

## 2023-10-25 NOTE — Patient Instructions (Signed)
 Medication Instructions:  Your physician has recommended you make the following change in your medication:   START hydrochlorothiazide  12.5 mg once daily   *If you need a refill on your cardiac medications before your next appointment, please call your pharmacy*  Lab Work: BMET in 2 weeks here in our office. Lab hours are 8-4 daily and they are closed for lunch from 1-2. No appointment is needed.   If you have labs (blood work) drawn today and your tests are completely normal, you will receive your results only by: MyChart Message (if you have MyChart) OR A paper copy in the mail If you have any lab test that is abnormal or we need to change your treatment, we will call you to review the results.  Testing/Procedures:    Please report to Radiology at the [facility] Main Entrance 30 minutes early for your test.  [address] [place], [place] [address]                         OR   Please report to Radiology at [facility] Main Entrance, medical mall, 30 mins prior to your test.  [address]  [place], [place]  How to Prepare for Your Cardiac PET/CT Stress Test:  Nothing to eat or drink, except water, 3 hours prior to arrival time.  NO caffeine/decaffeinated products, or chocolate 12 hours prior to arrival. (Please note decaffeinated beverages (teas/coffees) still contain caffeine).  If you have caffeine within 12 hours prior, the test will need to be rescheduled.  Medication instructions: Do not take erectile dysfunction medications for 72 hours prior to test (sildenafil, tadalafil) Do not take nitrates (isosorbide mononitrate, Ranexa) the day before or day of test Do not take tamsulosin the day before or morning of test Hold theophylline containing medications for 12 hours. Hold Dipyridamole 48 hours prior to the test.  Diabetic Preparation: If able to eat breakfast prior to 3 hour fasting, you may take all medications, including your  insulin. Do not worry if you miss your breakfast dose of insulin - start at your next meal. If you do not eat prior to 3 hour fast-Hold all diabetes (oral and insulin) medications. Patients who wear a continuous glucose monitor MUST remove the device prior to scanning.  You may take your remaining medications with water.  NO perfume, cologne or lotion on chest or abdomen area. FEMALES - Please avoid wearing dresses to this appointment.  Total time is 1 to 2 hours; you may want to bring reading material for the waiting time.  IF YOU THINK YOU MAY BE PREGNANT, OR ARE NURSING PLEASE INFORM THE TECHNOLOGIST.  In preparation for your appointment, medication and supplies will be purchased.  Appointment availability is limited, so if you need to cancel or reschedule, please call the Radiology Department Scheduler at [phone] 24 hours in advance to avoid a cancellation fee of $100.00  What to Expect When you Arrive:  Once you arrive and check in for your appointment, you will be taken to a preparation room within the Radiology Department.  A technologist or Nurse will obtain your medical history, verify that you are correctly prepped for the exam, and explain the procedure.  Afterwards, an IV will be started in your arm and electrodes will be placed on your skin for EKG monitoring during the stress portion of the exam. Then you will be escorted to the PET/CT scanner.  There, staff will  get you positioned on the scanner and obtain a blood pressure and EKG.  During the exam, you will continue to be connected to the EKG and blood pressure machines.  A small, safe amount of a radioactive tracer will be injected in your IV to obtain a series of pictures of your heart along with an injection of a stress agent.    After your Exam:  It is recommended that you eat a meal and drink a caffeinated beverage to counter act any effects of the stress agent.  Drink plenty of fluids for the remainder of the day and  urinate frequently for the first couple of hours after the exam.  Your doctor will inform you of your test results within 7-10 business days.  For more information and frequently asked questions, please visit our website: [email or web address]  For questions about your test or how to prepare for your test, please call: Cardiac Imaging Nurse Navigators Office: [phone]   Follow-Up: At [facility], you and your health needs are our priority.  As part of our continuing mission to provide you with exceptional heart care, our providers are all part of one team.  This team includes your primary Cardiologist (physician) and Advanced Practice Providers or APPs (Physician Assistants and Nurse Practitioners) who all work together to provide you with the care you need, when you need it.  Your next appointment:   6 week(s)  Provider:   Antionette Kirks, MD or Ronald Cockayne, NP

## 2023-11-10 ENCOUNTER — Encounter: Payer: Self-pay | Admitting: Cardiology

## 2023-11-10 ENCOUNTER — Ambulatory Visit (INDEPENDENT_AMBULATORY_CARE_PROVIDER_SITE_OTHER): Payer: Medicare HMO | Admitting: Cardiology

## 2023-11-10 VITALS — BP 132/72 | HR 77 | Ht 70.0 in | Wt 277.8 lb

## 2023-11-10 DIAGNOSIS — Z1329 Encounter for screening for other suspected endocrine disorder: Secondary | ICD-10-CM | POA: Diagnosis not present

## 2023-11-10 DIAGNOSIS — I10 Essential (primary) hypertension: Secondary | ICD-10-CM

## 2023-11-10 DIAGNOSIS — Z125 Encounter for screening for malignant neoplasm of prostate: Secondary | ICD-10-CM

## 2023-11-10 DIAGNOSIS — R7303 Prediabetes: Secondary | ICD-10-CM

## 2023-11-10 DIAGNOSIS — E782 Mixed hyperlipidemia: Secondary | ICD-10-CM | POA: Diagnosis not present

## 2023-11-10 NOTE — Progress Notes (Signed)
 Established Patient Office Visit  Subjective:  Patient ID: [name], male    DOB: 1956/06/15  Age: 68 y.o. MRN: [id number]  Chief Complaint  Patient presents with   Follow-up    4 month lab results    Patient in office for 4 month follow up, discuss recent lab work. Patient did not have blood work done. Patient reports feeling well, no new complaints today. Was started on hydrochlorothiazide  for lower extremity edema by cardiology, minimal improvement.  Will return for fasting lab work.      No other concerns at this time.   Past Medical History:  Diagnosis Date   Coronary artery disease    a. 12/2012 Acute inferior STEMI/PCI: occluded proximal RCA with mild LAD/left circumflex disease. Successful thrombectomy and drug-eluting stent placement to the proximal RCA. EF nl; b. 10/2013 MV: no ischemia/infarct.   Essential hypertension    Heart murmur    Hyperlipidemia    Morbid obesity (HCC)    Motion sickness    sea sick   Myocardial infarction [facility]) 12/2012   Near syncope    a. ER visit 01/2015 - ? etiology.   Nonadherence to medication    Sleep apnea    No CPAP   Stomach ulcer    Vasculitis (HCC) 2017   Treated with prednisone.   Wears dentures    Has full upper and lower.  Does not wear lower    Past Surgical History:  Procedure Laterality Date   APPENDECTOMY     CARDIAC CATHETERIZATION  6/14   [facility];EF 65%   CATARACT EXTRACTION W/PHACO Left 04/14/2022   Procedure: CATARACT EXTRACTION PHACO AND INTRAOCULAR LENS PLACEMENT (IOC) LEFT DIABETIC;  Surgeon: Annell Kidney, MD;  Location: [facility];  Service: Ophthalmology;  Laterality: Left;  sleep apnea 14.48 01:42.0   CATARACT EXTRACTION W/PHACO Right 04/28/2022   Procedure: CATARACT EXTRACTION PHACO AND INTRAOCULAR LENS PLACEMENT (IOC) RIGHT  DIABETIC 10.61 01:04.2;  Surgeon: Annell Kidney, MD;  Location: [facility];  Service: Ophthalmology;  Laterality: Right;  sleep apnea    COLONOSCOPY WITH PROPOFOL  [name] 08/18/2016   Procedure: COLONOSCOPY WITH PROPOFOL ;  Surgeon: Cassie Click, MD;  Location: [facility] END[facility];  Service: Endoscopy;  Laterality: [name];   HERNIA REPAIR      Social History   Socioeconomic History   Marital status: Divorced    Spouse name: Not on file   Number of children: Not on file   Years of education: Not on file   Highest education level: Not on file  Occupational History   Not on file  Tobacco Use   Smoking status: Former    Current packs/day: 0.00    Average packs/day: 1 pack/day for 30.0 years (30.0 ttl pk-yrs)    Types: Cigarettes    Start date: 12/19/1982    Quit date: 12/18/2012    Years since quitting: 10.9   Smokeless tobacco: Never  Vaping Use   Vaping status: Never Used  Substance and Sexual Activity   Alcohol use: No    Comment: history of ETOH last drink 1998   Drug use: No    Comment: past marijuana   Sexual activity: Not on file  Other Topics Concern   Not on file  Social History Narrative   Not on file   Social Drivers of Health   Financial Resource Strain: Not on file  Food Insecurity: Not on file  Transportation Needs: Not on file  Physical Activity: Not on file  Stress: Not on file  Social Connections: Not on file  Intimate Partner Violence: Not on file    Family History  Problem Relation Age of Onset   Heart failure Father    Heart disease Father     Allergies  Allergen Reactions   Xyzal [Levocetirizine]     Started with vasculitis after taking   Latex Rash    Mask from sleep apnea test    Outpatient Medications Prior to Visit  Medication Sig   amLODipine  (NORVASC ) 5 MG tablet Take 1 tablet (5 mg total) by mouth daily. PLEASE CALL [phone] TO SCHEDULE YEARLY FOLLOW UP PRIOR TO NEXT REFILL REQUEST. THANK YOU.   aspirin  81 MG chewable tablet Chew 81 mg by mouth daily.   carvedilol  (COREG ) 12.5 MG tablet TAKE 1 TABLET BY MOUTH TWICE A DAY   hydrochlorothiazide  (MICROZIDE ) 12.5 MG  capsule Take 1 capsule (12.5 mg total) by mouth daily.   lisinopril  (ZESTRIL ) 40 MG tablet Take 1 tablet (40 mg total) by mouth daily.   rosuvastatin  (CRESTOR ) 20 MG tablet TAKE 1 TABLET BY MOUTH DAILY   vitamin D3 (CHOLECALCIFEROL) 25 MCG tablet Take 1,000 Units by mouth daily.   [DISCONTINUED] Cetirizine HCl (ZYRTEC PO) Take by mouth daily. (Patient not taking: Reported on 10/25/2023)   [DISCONTINUED] gabapentin  (NEURONTIN ) 100 MG capsule Take 1 capsule (100 mg total) by mouth 2 (two) times daily. (Patient not taking: Reported on 11/10/2023)   [DISCONTINUED] valACYclovir (VALTREX) 1000 MG tablet Take 1,000 mg by mouth 3 (three) times daily. (Patient not taking: Reported on 11/10/2023)   No facility-administered medications prior to visit.    Review of Systems  Constitutional: Negative.   HENT: Negative.    Eyes: Negative.   Respiratory: Negative.  Negative for shortness of breath.   Cardiovascular: Negative.  Negative for chest pain.  Gastrointestinal: Negative.  Negative for abdominal pain, constipation and diarrhea.  Genitourinary: Negative.   Musculoskeletal:  Negative for joint pain and myalgias.  Skin: Negative.   Neurological: Negative.  Negative for dizziness and headaches.  Endo/Heme/Allergies: Negative.   All other systems reviewed and are negative.      Objective:   BP 132/72   Pulse 77   Ht 5\' 10"  (1.778 m)   Wt 277 lb 12.8 oz (126 kg)   SpO2 95%   BMI 39.86 kg/m   Vitals:   11/10/23 0903  BP: 132/72  Pulse: 77  Height: 5\' 10"  (1.778 m)  Weight: 277 lb 12.8 oz (126 kg)  SpO2: 95%  BMI (Calculated): 39.86    Physical Exam Nursing note reviewed.  Constitutional:      Appearance: Normal appearance. He is normal weight.  HENT:     Head: Normocephalic and atraumatic.     Nose: Nose normal.     Mouth/Throat:     Mouth: Mucous membranes are moist.     Pharynx: Oropharynx is clear.  Eyes:     Extraocular Movements: Extraocular movements intact.      Conjunctiva/sclera: Conjunctivae normal.     Pupils: Pupils are equal, round, and reactive to light.  Cardiovascular:     Rate and Rhythm: Normal rate and regular rhythm.     Pulses: Normal pulses.     Heart sounds: Normal heart sounds.  Pulmonary:     Effort: Pulmonary effort is normal.     Breath sounds: Normal breath sounds.  Abdominal:     General: Abdomen is flat. Bowel sounds are normal.     Palpations: Abdomen is soft.  Musculoskeletal:  General: Normal range of motion.     Cervical back: Normal range of motion.  Skin:    General: Skin is warm and dry.  Neurological:     General: No focal deficit present.     Mental Status: He is alert and oriented to person, place, and time.  Psychiatric:        Mood and Affect: Mood normal.        Behavior: Behavior normal.        Thought Content: Thought content normal.        Judgment: Judgment normal.      No results found for any visits on 11/10/23.  No results found for this or any previous visit (from the past 2160 hours).    Assessment & Plan:  Continue same medications. Return for fasting lab work.   Problem List Items Addressed This Visit       Cardiovascular and Mediastinum   Hypertension - Primary   Relevant Orders   CMP14+EGFR     Other   Hyperlipidemia   Relevant Orders   Lipid panel   Prediabetes   Relevant Orders   Hemoglobin A1c   Other Visit Diagnoses       Prostate cancer screening       Relevant Orders   PSA     Thyroid  disorder screening       Relevant Orders   TSH       Return in about 4 months (around 03/12/2024) for with fasting labs prior.   Total time spent: 25 minutes  [employer], NP  11/10/2023   This document may have been prepared by Dragon Voice Recognition software and as such may include unintentional dictation errors.

## 2023-11-30 ENCOUNTER — Other Ambulatory Visit: Payer: Self-pay

## 2023-11-30 ENCOUNTER — Telehealth: Payer: Self-pay | Admitting: Cardiovascular Disease

## 2023-11-30 ENCOUNTER — Other Ambulatory Visit: Payer: Self-pay | Admitting: Cardiovascular Disease

## 2023-11-30 MED ORDER — AMLODIPINE BESYLATE 5 MG PO TABS: 5.0000 mg | ORAL_TABLET | Freq: Every day | ORAL | 0 refills | Status: DC

## 2023-11-30 NOTE — Telephone Encounter (Signed)
*  STAT* If patient is at the pharmacy, call can be transferred to refill team.   1. Which medications need to be refilled? (please list name of each medication and dose if known)   amLODipine  (NORVASC ) 5 MG tablet  carvedilol  (COREG ) 12.5 MG tablet   2. Which pharmacy/location (including street and city if local pharmacy) is medication to be sent to? [facility] - [place], [place] - [address]    3. Do they need a 30 day or 90 day supply?  90 day supply

## 2023-12-06 ENCOUNTER — Ambulatory Visit: Admitting: Cardiology

## 2023-12-15 ENCOUNTER — Other Ambulatory Visit

## 2024-01-02 ENCOUNTER — Other Ambulatory Visit: Payer: Self-pay | Admitting: Cardiology

## 2024-01-19 ENCOUNTER — Ambulatory Visit: Admission: RE | Admit: 2024-01-19 | Source: Ambulatory Visit

## 2024-01-31 ENCOUNTER — Ambulatory Visit: Admitting: Cardiology

## 2024-01-31 ENCOUNTER — Other Ambulatory Visit: Payer: Self-pay | Admitting: Cardiology

## 2024-03-12 ENCOUNTER — Ambulatory Visit (INDEPENDENT_AMBULATORY_CARE_PROVIDER_SITE_OTHER): Admitting: Cardiology

## 2024-03-12 ENCOUNTER — Other Ambulatory Visit: Payer: Self-pay | Admitting: Cardiology

## 2024-03-12 ENCOUNTER — Encounter: Payer: Self-pay | Admitting: Cardiology

## 2024-03-12 VITALS — BP 118/76 | HR 63 | Ht 70.0 in | Wt 277.0 lb

## 2024-03-12 DIAGNOSIS — I10 Essential (primary) hypertension: Secondary | ICD-10-CM | POA: Diagnosis not present

## 2024-03-12 DIAGNOSIS — R7303 Prediabetes: Secondary | ICD-10-CM | POA: Diagnosis not present

## 2024-03-12 DIAGNOSIS — E782 Mixed hyperlipidemia: Secondary | ICD-10-CM

## 2024-03-12 DIAGNOSIS — Z1329 Encounter for screening for other suspected endocrine disorder: Secondary | ICD-10-CM

## 2024-03-12 DIAGNOSIS — Z125 Encounter for screening for malignant neoplasm of prostate: Secondary | ICD-10-CM

## 2024-03-12 NOTE — Progress Notes (Signed)
 Established Patient Office Visit  Subjective:  Patient ID: [name], male    DOB: 1956/04/30  Age: 68 y.o. MRN: [id number]  Chief Complaint  Patient presents with   Follow-up    4 Months Follow Up Lab Results    Patient in office for 4 month follow up, discuss recent lab results. Patient doing well, no complaints today. Did not have fasting blood work done, will get it done today and call with results.  Blood pressure controlled on recheck. Continue same medications.     No other concerns at this time.   Past Medical History:  Diagnosis Date   Coronary artery disease    a. 12/2012 Acute inferior STEMI/PCI: occluded proximal RCA with mild LAD/left circumflex disease. Successful thrombectomy and drug-eluting stent placement to the proximal RCA. EF nl; b. 10/2013 MV: no ischemia/infarct.   Essential hypertension    Heart murmur    Hyperlipidemia    Morbid obesity (HCC)    Motion sickness    sea sick   Myocardial infarction [facility]) 12/2012   Near syncope    a. ER visit 01/2015 - ? etiology.   Nonadherence to medication    Sleep apnea    No CPAP   Stomach ulcer    Vasculitis (HCC) 2017   Treated with prednisone.   Wears dentures    Has full upper and lower.  Does not wear lower    Past Surgical History:  Procedure Laterality Date   APPENDECTOMY     CARDIAC CATHETERIZATION  6/14   [facility];EF 65%   CATARACT EXTRACTION W/PHACO Left 04/14/2022   Procedure: CATARACT EXTRACTION PHACO AND INTRAOCULAR LENS PLACEMENT (IOC) LEFT DIABETIC;  Surgeon: Mittie Gaskin, MD;  Location: [facility];  Service: Ophthalmology;  Laterality: Left;  sleep apnea 14.48 01:42.0   CATARACT EXTRACTION W/PHACO Right 04/28/2022   Procedure: CATARACT EXTRACTION PHACO AND INTRAOCULAR LENS PLACEMENT (IOC) RIGHT  DIABETIC 10.61 01:04.2;  Surgeon: Mittie Gaskin, MD;  Location: [facility];  Service: Ophthalmology;  Laterality: Right;  sleep apnea   COLONOSCOPY WITH PROPOFOL   [name] 08/18/2016   Procedure: COLONOSCOPY WITH PROPOFOL ;  Surgeon: Lamar ONEIDA Holmes, MD;  Location: [facility];  Service: Endoscopy;  Laterality: [name];   HERNIA REPAIR      Social History   Socioeconomic History   Marital status: Divorced    Spouse name: Not on file   Number of children: Not on file   Years of education: Not on file   Highest education level: Not on file  Occupational History   Not on file  Tobacco Use   Smoking status: Former    Current packs/day: 0.00    Average packs/day: 1 pack/day for 30.0 years (30.0 ttl pk-yrs)    Types: Cigarettes    Start date: 12/19/1982    Quit date: 12/18/2012    Years since quitting: 11.2   Smokeless tobacco: Never  Vaping Use   Vaping status: Never Used  Substance and Sexual Activity   Alcohol use: No    Comment: history of ETOH last drink 1998   Drug use: No    Comment: past marijuana   Sexual activity: Not on file  Other Topics Concern   Not on file  Social History Narrative   Not on file   Social Drivers of Health   Financial Resource Strain: Not on file  Food Insecurity: Not on file  Transportation Needs: Not on file  Physical Activity: Not on file  Stress: Not on file  Social  Connections: Not on file  Intimate Partner Violence: Not on file    Family History  Problem Relation Age of Onset   Heart failure Father    Heart disease Father     Allergies  Allergen Reactions   Xyzal [Levocetirizine]     Started with vasculitis after taking   Latex Rash    Mask from sleep apnea test    Outpatient Medications Prior to Visit  Medication Sig   amLODipine  (NORVASC ) 5 MG tablet TAKE 1 TABLET BY MOUTH DAILY   aspirin  81 MG chewable tablet Chew 81 mg by mouth daily.   carvedilol  (COREG ) 12.5 MG tablet TAKE 1 TABLET BY MOUTH TWICE A DAY   hydrochlorothiazide  (MICROZIDE ) 12.5 MG capsule Take 1 capsule (12.5 mg total) by mouth daily.   lisinopril  (ZESTRIL ) 40 MG tablet Take 1 tablet (40 mg total) by mouth daily.    rosuvastatin  (CRESTOR ) 20 MG tablet TAKE 1 TABLET BY MOUTH DAILY   vitamin D3 (CHOLECALCIFEROL) 25 MCG tablet Take 1,000 Units by mouth daily.   No facility-administered medications prior to visit.    Review of Systems  Constitutional: Negative.   HENT: Negative.    Eyes: Negative.   Respiratory: Negative.  Negative for shortness of breath.   Cardiovascular: Negative.  Negative for chest pain.  Gastrointestinal: Negative.  Negative for abdominal pain, constipation and diarrhea.  Genitourinary: Negative.   Musculoskeletal:  Negative for joint pain and myalgias.  Skin: Negative.   Neurological: Negative.  Negative for dizziness and headaches.  Endo/Heme/Allergies: Negative.   All other systems reviewed and are negative.      Objective:   BP 118/76 (BP Location: Right Arm, Patient Position: Sitting, Cuff Size: Large)   Pulse 63   Ht 5' 10 (1.778 m)   Wt 277 lb (125.6 kg)   SpO2 93%   BMI 39.75 kg/m   Vitals:   03/12/24 0900 03/12/24 0929  BP: (!) 158/68 118/76  Pulse: 63   Height: 5' 10 (1.778 m)   Weight: 277 lb (125.6 kg)   SpO2: 93%   BMI (Calculated): 39.75     Physical Exam Nursing note reviewed.  Constitutional:      Appearance: Normal appearance. He is normal weight.  HENT:     Head: Normocephalic and atraumatic.     Nose: Nose normal.     Mouth/Throat:     Mouth: Mucous membranes are moist.     Pharynx: Oropharynx is clear.  Eyes:     Extraocular Movements: Extraocular movements intact.     Conjunctiva/sclera: Conjunctivae normal.     Pupils: Pupils are equal, round, and reactive to light.  Cardiovascular:     Rate and Rhythm: Normal rate and regular rhythm.     Pulses: Normal pulses.     Heart sounds: Normal heart sounds.  Pulmonary:     Effort: Pulmonary effort is normal.     Breath sounds: Normal breath sounds.  Abdominal:     General: Abdomen is flat. Bowel sounds are normal.     Palpations: Abdomen is soft.  Musculoskeletal:         General: Normal range of motion.     Cervical back: Normal range of motion.  Skin:    General: Skin is warm and dry.  Neurological:     General: No focal deficit present.     Mental Status: He is alert and oriented to person, place, and time.  Psychiatric:        Mood and Affect: Mood  normal.        Behavior: Behavior normal.        Thought Content: Thought content normal.        Judgment: Judgment normal.      No results found for any visits on 03/12/24.  No results found for this or any previous visit (from the past 2160 hours).    Assessment & Plan:  Continue same medications.  Lab work today.  Problem List Items Addressed This Visit       Cardiovascular and Mediastinum   Hypertension   Essential hypertension - Primary     Other   Hyperlipidemia   Prediabetes   Other Visit Diagnoses       Thyroid  disorder screening         Prostate cancer screening           Return in about 4 months (around 07/12/2024) for fasting labs prior.   Total time spent: 25 minutes  [employer], NP  03/12/2024   This document may have been prepared by Dragon Voice Recognition software and as such may include unintentional dictation errors.

## 2024-03-13 ENCOUNTER — Ambulatory Visit: Payer: Self-pay | Admitting: Cardiology

## 2024-03-13 LAB — CMP14+EGFR
ALT: 38 IU/L (ref 0–44)
AST: 30 IU/L (ref 0–40)
Albumin: 4.4 g/dL (ref 3.9–4.9)
Alkaline Phosphatase: 47 IU/L (ref 44–121)
BUN/Creatinine Ratio: 14 (ref 10–24)
BUN: 16 mg/dL (ref 8–27)
Bilirubin Total: 1.6 mg/dL — ABNORMAL HIGH (ref 0.0–1.2)
CO2: 23 mmol/L (ref 20–29)
Calcium: 9.5 mg/dL (ref 8.6–10.2)
Chloride: 98 mmol/L (ref 96–106)
Creatinine, Ser: 1.11 mg/dL (ref 0.76–1.27)
Globulin, Total: 3.2 g/dL (ref 1.5–4.5)
Glucose: 92 mg/dL (ref 70–99)
Potassium: 4.5 mmol/L (ref 3.5–5.2)
Sodium: 135 mmol/L (ref 134–144)
Total Protein: 7.6 g/dL (ref 6.0–8.5)
eGFR: 72 mL/min/1.73 (ref >59)

## 2024-03-13 LAB — HEMOGLOBIN A1C
Est. average glucose Bld gHb Est-mCnc: 126 mg/dL
Hgb A1c MFr Bld: 6 % — ABNORMAL HIGH (ref 4.8–5.6)

## 2024-03-13 LAB — LIPID PANEL
Chol/HDL Ratio: 3.3 ratio (ref 0.0–5.0)
Cholesterol, Total: 118 mg/dL (ref 100–199)
HDL: 36 mg/dL — ABNORMAL LOW (ref >39)
LDL Chol Calc (NIH): 55 mg/dL (ref 0–99)
Triglycerides: 161 mg/dL — ABNORMAL HIGH (ref 0–149)
VLDL Cholesterol Cal: 27 mg/dL (ref 5–40)

## 2024-03-13 LAB — PSA: Prostate Specific Ag, Serum: 1 ng/mL (ref 0.0–4.0)

## 2024-03-13 LAB — TSH: TSH: 1.04 u[IU]/mL (ref 0.450–4.500)

## 2024-03-13 NOTE — Progress Notes (Signed)
Pt informed

## 2024-04-04 ENCOUNTER — Other Ambulatory Visit: Payer: Self-pay | Admitting: Cardiology

## 2024-04-04 MED ORDER — AMLODIPINE BESYLATE 5 MG PO TABS: 5.0000 mg | ORAL_TABLET | Freq: Every day | ORAL | 3 refills | Status: AC

## 2024-04-04 NOTE — Addendum Note (Signed)
 Addended by: DARIO IZETTA CROME on: 04/04/2024 04:17 PM   Modules accepted: Orders

## 2024-04-16 ENCOUNTER — Other Ambulatory Visit: Payer: Self-pay | Admitting: Cardiovascular Disease

## 2024-04-17 ENCOUNTER — Telehealth: Payer: Self-pay | Admitting: Cardiovascular Disease

## 2024-04-17 MED ORDER — LISINOPRIL 40 MG PO TABS: 40.0000 mg | ORAL_TABLET | Freq: Every day | ORAL | 1 refills | Status: AC

## 2024-04-17 NOTE — Telephone Encounter (Signed)
 Pt's medication was sent to pt's pharmacy as requested. Confirmation received.

## 2024-04-17 NOTE — Telephone Encounter (Signed)
*  STAT* If patient is at the pharmacy, call can be transferred to refill team.   1. Which medications need to be refilled? (please list name of each medication and dose if known)  lisinopril  (ZESTRIL ) 40 MG tablet   2. Which pharmacy/location (including street and city if local pharmacy) is medication to be sent to? [facility] - [place], [place] - [address]  3. Do they need a 30 day or 90 day supply?  90 day supply

## 2024-06-20 ENCOUNTER — Ambulatory Visit: Admitting: Cardiology

## 2024-06-20 ENCOUNTER — Encounter: Payer: Self-pay | Admitting: Cardiology

## 2024-06-20 VITALS — BP 121/77 | HR 76 | Ht 70.0 in | Wt 282.0 lb

## 2024-06-20 DIAGNOSIS — J301 Allergic rhinitis due to pollen: Secondary | ICD-10-CM | POA: Diagnosis not present

## 2024-06-20 DIAGNOSIS — I10 Essential (primary) hypertension: Secondary | ICD-10-CM

## 2024-06-20 DIAGNOSIS — Z131 Encounter for screening for diabetes mellitus: Secondary | ICD-10-CM

## 2024-06-20 DIAGNOSIS — J0100 Acute maxillary sinusitis, unspecified: Secondary | ICD-10-CM | POA: Insufficient documentation

## 2024-06-20 MED ORDER — AMOXICILLIN-POT CLAVULANATE 875-125 MG PO TABS: 1.0000 | ORAL_TABLET | Freq: Two times a day (BID) | ORAL | 0 refills | Status: AC

## 2024-06-20 NOTE — Progress Notes (Signed)
 Established Patient Office Visit  Subjective:  Patient ID: [name], male    DOB: 1955/08/31  Age: 68 y.o. MRN: [id number]  Chief Complaint  Patient presents with   Acute Visit    Sinuses and coughing up phlegm for the past week.     Patient in office for an acute visit, patient complaining of a productive cough, congestion, sinus pressure, scratchy throat. Symptoms started about a week ago. Has not tried any OTC medicaitons. Will send in Augmentin . Recommend nasal spray, Mucinex. Drink plenty of water.  Blood pressure well controlled today.   Sinus Problem This is a new problem. The current episode started 1 to 4 weeks ago. The problem is unchanged. There has been no fever. Associated symptoms include congestion, coughing, sinus pressure and a sore throat. Pertinent negatives include no headaches or shortness of breath. Past treatments include nothing. The treatment provided no relief.    No other concerns at this time.   Past Medical History:  Diagnosis Date   Coronary artery disease    a. 12/2012 Acute inferior STEMI/PCI: occluded proximal RCA with mild LAD/left circumflex disease. Successful thrombectomy and drug-eluting stent placement to the proximal RCA. EF nl; b. 10/2013 MV: no ischemia/infarct.   Essential hypertension    Heart murmur    Hyperlipidemia    Morbid obesity (HCC)    Motion sickness    sea sick   Myocardial infarction [facility]) 12/2012   Near syncope    a. ER visit 01/2015 - ? etiology.   Nonadherence to medication    Sleep apnea    No CPAP   Stomach ulcer    Vasculitis 2017   Treated with prednisone.   Wears dentures    Has full upper and lower.  Does not wear lower    Past Surgical History:  Procedure Laterality Date   APPENDECTOMY     CARDIAC CATHETERIZATION  6/14   [facility];EF 65%   CATARACT EXTRACTION W/PHACO Left 04/14/2022   Procedure: CATARACT EXTRACTION PHACO AND INTRAOCULAR LENS PLACEMENT (IOC) LEFT DIABETIC;  Surgeon: Mittie Gaskin,  MD;  Location: [facility];  Service: Ophthalmology;  Laterality: Left;  sleep apnea 14.48 01:42.0   CATARACT EXTRACTION W/PHACO Right 04/28/2022   Procedure: CATARACT EXTRACTION PHACO AND INTRAOCULAR LENS PLACEMENT (IOC) RIGHT  DIABETIC 10.61 01:04.2;  Surgeon: Mittie Gaskin, MD;  Location: [facility];  Service: Ophthalmology;  Laterality: Right;  sleep apnea   COLONOSCOPY WITH PROPOFOL  [name] 08/18/2016   Procedure: COLONOSCOPY WITH PROPOFOL ;  Surgeon: Lamar ONEIDA Holmes, MD;  Location: [facility] END[facility];  Service: Endoscopy;  Laterality: [name];   HERNIA REPAIR      Social History   Socioeconomic History   Marital status: Divorced    Spouse name: Not on file   Number of children: Not on file   Years of education: Not on file   Highest education level: Not on file  Occupational History   Not on file  Tobacco Use   Smoking status: Former    Current packs/day: 0.00    Average packs/day: 1 pack/day for 30.0 years (30.0 ttl pk-yrs)    Types: Cigarettes    Start date: 12/19/1982    Quit date: 12/18/2012    Years since quitting: 11.5   Smokeless tobacco: Never  Vaping Use   Vaping status: Never Used  Substance and Sexual Activity   Alcohol use: No    Comment: history of ETOH last drink 1998   Drug use: No    Comment: past marijuana  Sexual activity: Not on file  Other Topics Concern   Not on file  Social History Narrative   Not on file   Social Drivers of Health   Tobacco Use: Medium Risk (06/20/2024)   Patient History    Smoking Tobacco Use: Former    Smokeless Tobacco Use: Never    Passive Exposure: Not on Actuary Strain: Not on file  Food Insecurity: Not on file  Transportation Needs: Not on file  Physical Activity: Not on file  Stress: Not on file  Social Connections: Not on file  Intimate Partner Violence: Not on file  Depression (PHQ2-9): Low Risk (08/08/2023)   Depression (PHQ2-9)    PHQ-2 Score: 3  Alcohol Screen: Not on file   Housing: Not on file  Utilities: Not on file  Health Literacy: Not on file    Family History  Problem Relation Age of Onset   Heart failure Father    Heart disease Father     Allergies[1]  Show/hide medication list[2]  Review of Systems  Constitutional: Negative.   HENT:  Positive for congestion, sinus pressure and sore throat.   Eyes: Negative.   Respiratory:  Positive for cough. Negative for shortness of breath.   Cardiovascular: Negative.  Negative for chest pain.  Gastrointestinal: Negative.  Negative for abdominal pain, constipation and diarrhea.  Genitourinary: Negative.   Musculoskeletal:  Negative for joint pain and myalgias.  Skin: Negative.   Neurological: Negative.  Negative for dizziness and headaches.  Endo/Heme/Allergies: Negative.   All other systems reviewed and are negative.      Objective:   BP 121/77   Pulse 76   Ht 5' 10 (1.778 m)   Wt 282 lb (127.9 kg)   SpO2 98%   BMI 40.46 kg/m   Vitals:   06/20/24 0909  BP: 121/77  Pulse: 76  Height: 5' 10 (1.778 m)  Weight: 282 lb (127.9 kg)  SpO2: 98%  BMI (Calculated): 40.46    Physical Exam Nursing note reviewed.  Constitutional:      Appearance: Normal appearance. He is normal weight.  HENT:     Head: Normocephalic and atraumatic.     Nose: Nose normal.     Mouth/Throat:     Mouth: Mucous membranes are moist.     Pharynx: Oropharynx is clear.  Eyes:     Extraocular Movements: Extraocular movements intact.     Conjunctiva/sclera: Conjunctivae normal.     Pupils: Pupils are equal, round, and reactive to light.  Cardiovascular:     Rate and Rhythm: Normal rate and regular rhythm.     Pulses: Normal pulses.     Heart sounds: Normal heart sounds.  Pulmonary:     Effort: Pulmonary effort is normal.     Breath sounds: Normal breath sounds.  Abdominal:     General: Abdomen is flat. Bowel sounds are normal.     Palpations: Abdomen is soft.  Musculoskeletal:        General: Normal  range of motion.     Cervical back: Normal range of motion.  Skin:    General: Skin is warm and dry.  Neurological:     General: No focal deficit present.     Mental Status: He is alert and oriented to person, place, and time.  Psychiatric:        Mood and Affect: Mood normal.        Behavior: Behavior normal.        Thought Content: Thought content normal.  Judgment: Judgment normal.      No results found for any visits on 06/20/24.  No results found for this or any previous visit (from the past 2160 hours).    Assessment & Plan:  Augmentin  Nasal spray Mucinex Drink plenty of water  Problem List Items Addressed This Visit       Cardiovascular and Mediastinum   Hypertension     Respiratory   Allergic rhinitis due to pollen - Primary   Acute non-recurrent maxillary sinusitis   Relevant Medications   amoxicillin -clavulanate (AUGMENTIN ) 875-125 MG tablet    Return if symptoms worsen or fail to improve, for as scheduled.   Total time spent: 25 minutes. This time includes review of previous notes and results and patient face to face interaction during today's visit.    Jeoffrey Pollen, NP  06/20/2024   This document may have been prepared by Dragon Voice Recognition software and as such may include unintentional dictation errors.     [1]  Allergies Allergen Reactions   Xyzal [Levocetirizine]     Started with vasculitis after taking   Latex Rash    Mask from sleep apnea test  [2]  Outpatient Medications Prior to Visit  Medication Sig   amLODipine  (NORVASC ) 5 MG tablet Take 1 tablet (5 mg total) by mouth daily.   aspirin  81 MG chewable tablet Chew 81 mg by mouth daily.   carvedilol  (COREG ) 12.5 MG tablet TAKE 1 TABLET BY MOUTH TWICE A DAY   hydrochlorothiazide  (MICROZIDE ) 12.5 MG capsule Take 1 capsule (12.5 mg total) by mouth daily.   lisinopril  (ZESTRIL ) 40 MG tablet Take 1 tablet (40 mg total) by mouth daily.   rosuvastatin  (CRESTOR ) 20 MG tablet  TAKE 1 TABLET BY MOUTH DAILY   vitamin D3 (CHOLECALCIFEROL) 25 MCG tablet Take 1,000 Units by mouth daily.   No facility-administered medications prior to visit.

## 2024-07-11 ENCOUNTER — Other Ambulatory Visit: Payer: Self-pay | Admitting: Cardiovascular Disease

## 2024-07-11 ENCOUNTER — Other Ambulatory Visit

## 2024-07-11 DIAGNOSIS — Z125 Encounter for screening for malignant neoplasm of prostate: Secondary | ICD-10-CM

## 2024-07-11 DIAGNOSIS — E782 Mixed hyperlipidemia: Secondary | ICD-10-CM

## 2024-07-11 DIAGNOSIS — R7303 Prediabetes: Secondary | ICD-10-CM

## 2024-07-11 DIAGNOSIS — Z1329 Encounter for screening for other suspected endocrine disorder: Secondary | ICD-10-CM

## 2024-07-11 DIAGNOSIS — I10 Essential (primary) hypertension: Secondary | ICD-10-CM

## 2024-07-12 ENCOUNTER — Encounter: Payer: Self-pay | Admitting: Cardiology

## 2024-07-12 ENCOUNTER — Ambulatory Visit: Payer: Self-pay | Admitting: Cardiology

## 2024-07-12 ENCOUNTER — Ambulatory Visit (INDEPENDENT_AMBULATORY_CARE_PROVIDER_SITE_OTHER): Admitting: Cardiology

## 2024-07-12 VITALS — BP 162/60 | HR 72 | Ht 70.0 in | Wt 283.0 lb

## 2024-07-12 DIAGNOSIS — R7303 Prediabetes: Secondary | ICD-10-CM | POA: Diagnosis not present

## 2024-07-12 DIAGNOSIS — R200 Anesthesia of skin: Secondary | ICD-10-CM | POA: Insufficient documentation

## 2024-07-12 DIAGNOSIS — I10 Essential (primary) hypertension: Secondary | ICD-10-CM

## 2024-07-12 DIAGNOSIS — E782 Mixed hyperlipidemia: Secondary | ICD-10-CM | POA: Diagnosis not present

## 2024-07-12 DIAGNOSIS — Z6841 Body Mass Index (BMI) 40.0 and over, adult: Secondary | ICD-10-CM

## 2024-07-12 DIAGNOSIS — E66813 Obesity, class 3: Secondary | ICD-10-CM | POA: Diagnosis not present

## 2024-07-12 LAB — CMP14+EGFR
ALT: 44 IU/L (ref 0–44)
AST: 31 IU/L (ref 0–40)
Albumin: 4.3 g/dL (ref 3.9–4.9)
Alkaline Phosphatase: 40 IU/L — ABNORMAL LOW (ref 47–123)
BUN/Creatinine Ratio: 14 (ref 10–24)
BUN: 16 mg/dL (ref 8–27)
Bilirubin Total: 1.6 mg/dL — ABNORMAL HIGH (ref 0.0–1.2)
CO2: 24 mmol/L (ref 20–29)
Calcium: 9.4 mg/dL (ref 8.6–10.2)
Chloride: 97 mmol/L (ref 96–106)
Creatinine, Ser: 1.11 mg/dL (ref 0.76–1.27)
Globulin, Total: 3.3 g/dL (ref 1.5–4.5)
Glucose: 74 mg/dL (ref 70–99)
Potassium: 5.1 mmol/L (ref 3.5–5.2)
Sodium: 136 mmol/L (ref 134–144)
Total Protein: 7.6 g/dL (ref 6.0–8.5)
eGFR: 72 mL/min/1.73

## 2024-07-12 LAB — TSH: TSH: 2.05 u[IU]/mL (ref 0.450–4.500)

## 2024-07-12 LAB — LIPID PANEL
Chol/HDL Ratio: 3.7 ratio (ref 0.0–5.0)
Cholesterol, Total: 131 mg/dL (ref 100–199)
HDL: 35 mg/dL — ABNORMAL LOW
LDL Chol Calc (NIH): 62 mg/dL (ref 0–99)
Triglycerides: 208 mg/dL — ABNORMAL HIGH (ref 0–149)
VLDL Cholesterol Cal: 34 mg/dL (ref 5–40)

## 2024-07-12 LAB — HEMOGLOBIN A1C
Est. average glucose Bld gHb Est-mCnc: 134 mg/dL
Hgb A1c MFr Bld: 6.3 % — ABNORMAL HIGH (ref 4.8–5.6)

## 2024-07-12 LAB — PSA: Prostate Specific Ag, Serum: 1 ng/mL (ref 0.0–4.0)

## 2024-07-12 NOTE — Progress Notes (Signed)
 Pt. Notified.

## 2024-07-12 NOTE — Telephone Encounter (Signed)
 Patient [name]  @ 1:22p asking if there was going to be something for mucus called into his pharmacy
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
## 2024-11-09 ENCOUNTER — Ambulatory Visit: Admitting: Internal Medicine
